# Patient Record
Sex: Male | Born: 1965 | Race: White | Hispanic: No | Marital: Single | Smoking: Former smoker
Health system: Southern US, Community
[De-identification: ages and names within clinical notes are randomized; demographics above are authoritative.]

## PROBLEM LIST (undated history)

## (undated) DIAGNOSIS — K760 Fatty (change of) liver, not elsewhere classified: Secondary | ICD-10-CM

## (undated) DIAGNOSIS — F419 Anxiety disorder, unspecified: Secondary | ICD-10-CM

## (undated) DIAGNOSIS — K219 Gastro-esophageal reflux disease without esophagitis: Secondary | ICD-10-CM

## (undated) DIAGNOSIS — L509 Urticaria, unspecified: Secondary | ICD-10-CM

## (undated) DIAGNOSIS — E7800 Pure hypercholesterolemia, unspecified: Secondary | ICD-10-CM

## (undated) DIAGNOSIS — I10 Essential (primary) hypertension: Secondary | ICD-10-CM

## (undated) DIAGNOSIS — E119 Type 2 diabetes mellitus without complications: Secondary | ICD-10-CM

## (undated) DIAGNOSIS — E785 Hyperlipidemia, unspecified: Secondary | ICD-10-CM

## (undated) DIAGNOSIS — U071 COVID-19: Secondary | ICD-10-CM

## (undated) HISTORY — PX: LUMBAR DISC SURGERY: SHX700

## (undated) HISTORY — DX: Gastro-esophageal reflux disease without esophagitis: K21.9

## (undated) HISTORY — DX: Fatty (change of) liver, not elsewhere classified: K76.0

## (undated) HISTORY — DX: Pure hypercholesterolemia, unspecified: E78.00

## (undated) HISTORY — PX: KNEE SURGERY: SHX244

## (undated) HISTORY — DX: Essential (primary) hypertension: I10

## (undated) HISTORY — DX: COVID-19: U07.1

## (undated) HISTORY — PX: ANKLE SURGERY: SHX546

## (undated) HISTORY — DX: Anxiety disorder, unspecified: F41.9

## (undated) HISTORY — DX: Type 2 diabetes mellitus without complications: E11.9

## (undated) HISTORY — DX: Urticaria, unspecified: L50.9

## (undated) HISTORY — DX: Hyperlipidemia, unspecified: E78.5

---

## 2015-10-13 DIAGNOSIS — Z23 Encounter for immunization: Secondary | ICD-10-CM | POA: Diagnosis not present

## 2015-10-16 DIAGNOSIS — H9202 Otalgia, left ear: Secondary | ICD-10-CM | POA: Diagnosis not present

## 2015-11-07 DIAGNOSIS — Z79899 Other long term (current) drug therapy: Secondary | ICD-10-CM | POA: Diagnosis not present

## 2015-11-15 DIAGNOSIS — E7800 Pure hypercholesterolemia, unspecified: Secondary | ICD-10-CM | POA: Diagnosis not present

## 2015-11-15 DIAGNOSIS — I10 Essential (primary) hypertension: Secondary | ICD-10-CM | POA: Diagnosis not present

## 2016-01-05 DIAGNOSIS — J209 Acute bronchitis, unspecified: Secondary | ICD-10-CM | POA: Diagnosis not present

## 2016-02-17 DIAGNOSIS — Z20828 Contact with and (suspected) exposure to other viral communicable diseases: Secondary | ICD-10-CM | POA: Diagnosis not present

## 2016-05-09 DIAGNOSIS — J209 Acute bronchitis, unspecified: Secondary | ICD-10-CM | POA: Diagnosis not present

## 2016-05-09 DIAGNOSIS — J0100 Acute maxillary sinusitis, unspecified: Secondary | ICD-10-CM | POA: Diagnosis not present

## 2016-05-09 DIAGNOSIS — R062 Wheezing: Secondary | ICD-10-CM | POA: Diagnosis not present

## 2016-05-09 DIAGNOSIS — R0602 Shortness of breath: Secondary | ICD-10-CM | POA: Diagnosis not present

## 2016-05-09 DIAGNOSIS — R0600 Dyspnea, unspecified: Secondary | ICD-10-CM | POA: Diagnosis not present

## 2016-07-29 DIAGNOSIS — J0100 Acute maxillary sinusitis, unspecified: Secondary | ICD-10-CM | POA: Diagnosis not present

## 2016-07-29 DIAGNOSIS — J209 Acute bronchitis, unspecified: Secondary | ICD-10-CM | POA: Diagnosis not present

## 2016-09-11 DIAGNOSIS — Z125 Encounter for screening for malignant neoplasm of prostate: Secondary | ICD-10-CM | POA: Diagnosis not present

## 2016-09-11 DIAGNOSIS — Z23 Encounter for immunization: Secondary | ICD-10-CM | POA: Diagnosis not present

## 2016-09-11 DIAGNOSIS — Z6831 Body mass index (BMI) 31.0-31.9, adult: Secondary | ICD-10-CM | POA: Diagnosis not present

## 2016-09-11 DIAGNOSIS — Z0000 Encounter for general adult medical examination without abnormal findings: Secondary | ICD-10-CM | POA: Diagnosis not present

## 2016-10-16 DIAGNOSIS — H5213 Myopia, bilateral: Secondary | ICD-10-CM | POA: Diagnosis not present

## 2017-04-02 DIAGNOSIS — I10 Essential (primary) hypertension: Secondary | ICD-10-CM | POA: Diagnosis not present

## 2017-04-02 DIAGNOSIS — E7800 Pure hypercholesterolemia, unspecified: Secondary | ICD-10-CM | POA: Diagnosis not present

## 2017-04-02 DIAGNOSIS — Z1339 Encounter for screening examination for other mental health and behavioral disorders: Secondary | ICD-10-CM | POA: Diagnosis not present

## 2017-04-02 DIAGNOSIS — Z1331 Encounter for screening for depression: Secondary | ICD-10-CM | POA: Diagnosis not present

## 2017-04-02 DIAGNOSIS — Z6834 Body mass index (BMI) 34.0-34.9, adult: Secondary | ICD-10-CM | POA: Diagnosis not present

## 2017-07-14 DIAGNOSIS — B029 Zoster without complications: Secondary | ICD-10-CM | POA: Diagnosis not present

## 2017-10-07 DIAGNOSIS — B370 Candidal stomatitis: Secondary | ICD-10-CM | POA: Diagnosis not present

## 2017-12-03 DIAGNOSIS — Z6833 Body mass index (BMI) 33.0-33.9, adult: Secondary | ICD-10-CM | POA: Diagnosis not present

## 2017-12-03 DIAGNOSIS — Z125 Encounter for screening for malignant neoplasm of prostate: Secondary | ICD-10-CM | POA: Diagnosis not present

## 2017-12-03 DIAGNOSIS — Z13220 Encounter for screening for lipoid disorders: Secondary | ICD-10-CM | POA: Diagnosis not present

## 2017-12-03 DIAGNOSIS — Z0000 Encounter for general adult medical examination without abnormal findings: Secondary | ICD-10-CM | POA: Diagnosis not present

## 2017-12-03 DIAGNOSIS — Z23 Encounter for immunization: Secondary | ICD-10-CM | POA: Diagnosis not present

## 2017-12-09 DIAGNOSIS — H524 Presbyopia: Secondary | ICD-10-CM | POA: Diagnosis not present

## 2018-01-20 ENCOUNTER — Ambulatory Visit (INDEPENDENT_AMBULATORY_CARE_PROVIDER_SITE_OTHER): Payer: BLUE CROSS/BLUE SHIELD | Admitting: Pulmonary Disease

## 2018-01-20 ENCOUNTER — Encounter: Payer: Self-pay | Admitting: Pulmonary Disease

## 2018-01-20 VITALS — BP 120/84 | HR 67 | Ht 74.0 in | Wt 273.0 lb

## 2018-01-20 DIAGNOSIS — G4733 Obstructive sleep apnea (adult) (pediatric): Secondary | ICD-10-CM

## 2018-01-20 MED ORDER — MONTELUKAST SODIUM 10 MG PO TABS: 10.0000 mg | ORAL_TABLET | Freq: Every day | ORAL | 11 refills | Status: DC

## 2018-01-20 NOTE — Progress Notes (Signed)
Dan Miller    [id number]    07/16/1965  Primary Care Physician:Redding, Valrie HartJohn F. II, MD  Referring Physician: Noni Saupeedding, John F. II, MD [address] [place], [place] [id number]  Chief complaint:   Patient with a past history of obstructive sleep apnea with recurrence of symptoms  HPI:  Was diagnosed with obstructive sleep apnea over 10 years ago, quit using his machine following significant weight loss and improvement in symptoms He has gained some weight recently with recurrence of his symptoms of daytime sleepiness and snoring Usually goes to bed between 9 and 12 PM, falls asleep almost immediately Wakes up about 1-2 times during the night Final wake up time about 6-7 a.m. Denies any nighttime headaches No dryness of his mouth in the mornings No significant night sweats  No history of obstructive sleep apnea in the family Social smoking in the remote past  He is not limited with activities of daily living  He feels generally well apart from his sleepiness and complaints of snoring  He does suffer from allergies and a lot of nasal stuffiness and congestion  He does have an oral device that he uses at present now but does not seem to be working, snores with the device in place   Outpatient Encounter Medications as of 01/20/2018  Medication Sig  . buPROPion (WELLBUTRIN) 100 MG tablet Take 100 mg by mouth daily.  . cetirizine (ZYRTEC) 5 MG tablet Take 5 mg by mouth daily.  Marland Kitchen. esomeprazole (NEXIUM) 20 MG capsule Take 20 mg by mouth daily.  . fluticasone (FLONASE) 50 MCG/ACT nasal spray Place 2 sprays into both nostrils daily.  Marland Kitchen. lisinopril (PRINIVIL,ZESTRIL) 10 MG tablet Take 10 mg by mouth daily.   No facility-administered encounter medications on file as of 01/20/2018.     Allergies as of 01/20/2018  . (No Known Allergies)    No past medical history on file.  No family history on file.  Social History   Socioeconomic History  . Marital status:  Single    Spouse name: Not on file  . Number of children: Not on file  . Years of education: Not on file  . Highest education level: Not on file  Occupational History  . Not on file  Social Needs  . Financial resource strain: Not on file  . Food insecurity:    Worry: Not on file    Inability: Not on file  . Transportation needs:    Medical: Not on file    Non-medical: Not on file  Tobacco Use  . Smoking status: Never Smoker  . Smokeless tobacco: Never Used  Substance and Sexual Activity  . Alcohol use: Not on file  . Drug use: Not on file  . Sexual activity: Not on file  Lifestyle  . Physical activity:    Days per week: Not on file    Minutes per session: Not on file  . Stress: Not on file  Relationships  . Social connections:    Talks on phone: Not on file    Gets together: Not on file    Attends religious service: Not on file    Active member of club or organization: Not on file    Attends meetings of clubs or organizations: Not on file    Relationship status: Not on file  . Intimate partner violence:    Fear of current or ex partner: Not on file    Emotionally abused: Not on file  Physically abused: Not on file    Forced sexual activity: Not on file  Other Topics Concern  . Not on file  Social History Narrative  . Not on file    Review of Systems  Constitutional: Negative for fatigue.  HENT: Negative.   Eyes: Negative.   Respiratory: Positive for apnea.   Cardiovascular: Negative.   Gastrointestinal: Negative.   Psychiatric/Behavioral: Positive for sleep disturbance.    Vitals:   01/20/18 0912  BP: 120/84  Pulse: 67  SpO2: 97%     Physical Exam  Constitutional: He appears well-developed and well-nourished.  HENT:  Head: Normocephalic and atraumatic.  Mallampati 2  Eyes: Pupils are equal, round, and reactive to light. Conjunctivae and EOM are normal. Right eye exhibits no discharge. Left eye exhibits no discharge.  Neck: Normal range of  motion. Neck supple. No tracheal deviation present. No thyromegaly present.  Cardiovascular: Normal rate.  Pulmonary/Chest: Effort normal and breath sounds normal. No respiratory distress. He has no wheezes.  Abdominal: Soft. Bowel sounds are normal. He exhibits no distension. There is no abdominal tenderness.    Results of the Epworth flowsheet 01/20/2018  Sitting and reading 1  Watching TV 1  Sitting, inactive in a public place (e.g. a theatre or a meeting) 1  As a passenger in a car for an hour without a break 1  Lying down to rest in the afternoon when circumstances permit 3  Sitting and talking to someone 1  Sitting quietly after a lunch without alcohol 1  In a car, while stopped for a few minutes in traffic 1  Total score 10   Assessment:  .  History of moderate obstructive sleep apnea -At least moderate severity at previous diagnosis  .  Obesity -Has gained a significant amount of weight recently with recurrence of his symptoms  .  Excessive daytime sleepiness  Plan/Recommendations:  .  Home sleep study .  Pathophysiology of sleep disordered breathing discussed . Treatment options discussed .  Encouraged to continue working on weight loss efforts I will see him back in the office in about 3 months   Virl DiamondAdewale Demarrius Guerrero MD [facility] 01/20/2018, 9:28 AM  CC: Noni Saupeedding, John F. II, MD

## 2018-01-20 NOTE — Patient Instructions (Signed)
History of obstructive sleep apnea recurrence of symptoms Nasal stuffiness/allergy  We will set you up with a home sleep study We will contact you as results become available  We will see you back in the office in 3 months We will give you a prescription for Singulair to be used daily  Call with any significant concerns   Sleep Apnea Sleep apnea is a condition in which breathing pauses or becomes shallow during sleep. Episodes of sleep apnea usually last 10 seconds or longer, and they may occur as many as 20 times an hour. Sleep apnea disrupts your sleep and keeps your body from getting the rest that it needs. This condition can increase your risk of certain health problems, including:  Heart attack.  Stroke.  Obesity.  Diabetes.  Heart failure.  Irregular heartbeat. There are three kinds of sleep apnea:  Obstructive sleep apnea. This kind is caused by a blocked or collapsed airway.  Central sleep apnea. This kind happens when the part of the brain that controls breathing does not send the correct signals to the muscles that control breathing.  Mixed sleep apnea. This is a combination of obstructive and central sleep apnea. What are the causes? The most common cause of this condition is a collapsed or blocked airway. An airway can collapse or become blocked if:  Your throat muscles are abnormally relaxed.  Your tongue and tonsils are larger than normal.  You are overweight.  Your airway is smaller than normal. What increases the risk? This condition is more likely to develop in people who:  Are overweight.  Smoke.  Have a smaller than normal airway.  Are elderly.  Are male.  Drink alcohol.  Take sedatives or tranquilizers.  Have a family history of sleep apnea. What are the signs or symptoms? Symptoms of this condition include:  Trouble staying asleep.  Daytime sleepiness and tiredness.  Irritability.  Loud snoring.  Morning  headaches.  Trouble concentrating.  Forgetfulness.  Decreased interest in sex.  Unexplained sleepiness.  Mood swings.  Personality changes.  Feelings of depression.  Waking up often during the night to urinate.  Dry mouth.  Sore throat. How is this diagnosed? This condition may be diagnosed with:  A medical history.  A physical exam.  A series of tests that are done while you are sleeping (sleep study). These tests are usually done in a sleep lab, but they may also be done at home. How is this treated? Treatment for this condition aims to restore normal breathing and to ease symptoms during sleep. It may involve managing health issues that can affect breathing, such as high blood pressure or obesity. Treatment may include:  Sleeping on your side.  Using a decongestant if you have nasal congestion.  Avoiding the use of depressants, including alcohol, sedatives, and narcotics.  Losing weight if you are overweight.  Making changes to your diet.  Quitting smoking.  Using a device to open your airway while you sleep, such as: ? An oral appliance. This is a custom-made mouthpiece that shifts your lower jaw forward. ? A continuous positive airway pressure (CPAP) device. This device delivers oxygen to your airway through a mask. ? A nasal expiratory positive airway pressure (EPAP) device. This device has valves that you put into each nostril. ? A bi-level positive airway pressure (BPAP) device. This device delivers oxygen to your airway through a mask.  Surgery if other treatments do not work. During surgery, excess tissue is removed to create a wider  airway. It is important to get treatment for sleep apnea. Without treatment, this condition can lead to:  High blood pressure.  Coronary artery disease.  (Men) An inability to achieve or maintain an erection (impotence).  Reduced thinking abilities. Follow these instructions at home:  Make any lifestyle changes that  your health care provider recommends.  Eat a healthy, well-balanced diet.  Take over-the-counter and prescription medicines only as told by your health care provider.  Avoid using depressants, including alcohol, sedatives, and narcotics.  Take steps to lose weight if you are overweight.  If you were given a device to open your airway while you sleep, use it only as told by your health care provider.  Do not use any tobacco products, such as cigarettes, chewing tobacco, and e-cigarettes. If you need help quitting, ask your health care provider.  Keep all follow-up visits as told by your health care provider. This is important. Contact a health care provider if:  The device that you received to open your airway during sleep is uncomfortable or does not seem to be working.  Your symptoms do not improve.  Your symptoms get worse. Get help right away if:  You develop chest pain.  You develop shortness of breath.  You develop discomfort in your back, arms, or stomach.  You have trouble speaking.  You have weakness on one side of your body.  You have drooping in your face. These symptoms may represent a serious problem that is an emergency. Do not wait to see if the symptoms will go away. Get medical help right away. Call your local emergency services (911 in the [place] Do not drive yourself to the hospital. This information is not intended to replace advice given to you by your health care provider. Make sure you discuss any questions you have with your health care provider. Document Released: 12/14/2001 Document Revised: 07/22/2016 Document Reviewed: 10/03/2014 Elsevier Interactive Patient Education  2019 [employer]

## 2018-01-21 ENCOUNTER — Encounter: Payer: Self-pay | Admitting: Allergy and Immunology

## 2018-01-21 ENCOUNTER — Ambulatory Visit: Payer: BLUE CROSS/BLUE SHIELD | Admitting: Allergy and Immunology

## 2018-01-21 VITALS — BP 118/62 | HR 64 | Resp 16

## 2018-01-21 DIAGNOSIS — J301 Allergic rhinitis due to pollen: Secondary | ICD-10-CM

## 2018-01-21 DIAGNOSIS — Z91018 Allergy to other foods: Secondary | ICD-10-CM

## 2018-01-21 DIAGNOSIS — J3089 Other allergic rhinitis: Secondary | ICD-10-CM | POA: Diagnosis not present

## 2018-01-21 DIAGNOSIS — Z79899 Other long term (current) drug therapy: Secondary | ICD-10-CM

## 2018-01-21 NOTE — Progress Notes (Signed)
Dear Dr. Jeanie Seweredding,  Thank you for referring Dan Miller to the [facility] on 01/21/2018.   Below is a summation of this patient's evaluation and recommendations.  Thank you for your referral. I will keep you informed about this patient's response to treatment.   If you have any questions please do not hesitate to contact me.   Sincerely,  Jessica PriestEric J. , MD Allergy / Immunology [facility]    ______________________________________________________________________    NEW PATIENT NOTE  Referring Provider: Noni Saupeedding, John F. II, MD Primary Provider: Noni Saupeedding, John F. II, MD Date of office visit: 01/21/2018    Subjective:   Chief Complaint:  Dan Miller (DOB: 09/28/1965) is a 53 y.o. male who presents to the clinic on 01/21/2018 with a chief complaint of Food Intolerance .     HPI: Dan Miller presents to this clinic in evaluation of allergic rhinoconjunctivitis and food allergy directed against shrimp.  He was seen by Dr. Willa RoughHicks many years ago for the same issues.  Concerning his allergic rhinoconjunctivitis, he uses Zyrtec and Flonase which has helped him but he still remains with nasal congestion and sneezing especially during seasonal times of the year.  He has recently been given Singulair by his pulmonologist to see if this helps but he has not tried this medication yet.  He is seeing his pulmonologist for sleep apnea related to weight gain.  Concerning his food allergy, he has had a history of developing facial hives after eating shrimp usually about 2 hours after consuming this food product back in 2013.  However, he has been eating shrimp intermittently since that point in time and in fact he has eaten shrimp about 6 times in 2019 without developing any hives.  He is usually using a Zyrtec prior to eating shrimp as he uses Zyrtec on most days.  He eats oysters and lobsters and clams and  squid and Octopus with no problem.  He does remain away from crab.  Previous skin testing performed 07 July 2014 identified slight sensitivity directed against shrimp.  He also had hypersensitivity directed against a multitude of different allergens including grasses, weeds, trees, house dust mite, dog, cockroach, and mold.  Past Medical History:  Diagnosis Date  . Urticaria     History reviewed. No pertinent surgical history.  Allergies as of 01/21/2018      Reactions   Shrimp [shellfish Allergy] Hives      Medication List      buPROPion 100 MG tablet Commonly known as:  WELLBUTRIN Take 100 mg by mouth daily.   cetirizine 5 MG tablet Commonly known as:  ZYRTEC Take 5 mg by mouth daily.   esomeprazole 20 MG capsule Commonly known as:  NEXIUM Take 20 mg by mouth daily.   fluticasone 50 MCG/ACT nasal spray Commonly known as:  FLONASE Place 2 sprays into both nostrils daily.   lisinopril 10 MG tablet Commonly known as:  PRINIVIL,ZESTRIL Take 10 mg by mouth daily.   montelukast 10 MG tablet Commonly known as:  SINGULAIR Take 1 tablet (10 mg total) by mouth at bedtime.       Review of systems negative except as noted in HPI / PMHx or noted below:  Review of Systems  Constitutional: Negative.   HENT: Negative.   Eyes: Negative.   Respiratory: Negative.   Cardiovascular: Negative.   Gastrointestinal: Negative.   Genitourinary: Negative.   Musculoskeletal: Negative.  Skin: Negative.   Neurological: Negative.   Endo/Heme/Allergies: Negative.   Psychiatric/Behavioral: Negative.     History reviewed. No pertinent family history.  Social History   Socioeconomic History  . Marital status: Single    Spouse name: Not on file  . Number of children: Not on file  . Years of education: Not on file  . Highest education level: Not on file  Occupational History  . Not on file  Social Needs  . Financial resource strain: Not on file  . Food insecurity:    Worry:  Not on file    Inability: Not on file  . Transportation needs:    Medical: Not on file    Non-medical: Not on file  Tobacco Use  . Smoking status: Never Smoker  . Smokeless tobacco: Never Used  Substance and Sexual Activity  . Alcohol use: Not on file  . Drug use: Not on file  . Sexual activity: Not on file  Lifestyle  . Physical activity:    Days per week: Not on file    Minutes per session: Not on file  . Stress: Not on file  Relationships  . Social connections:    Talks on phone: Not on file    Gets together: Not on file    Attends religious service: Not on file    Active member of club or organization: Not on file    Attends meetings of clubs or organizations: Not on file    Relationship status: Not on file  . Intimate partner violence:    Fear of current or ex partner: Not on file    Emotionally abused: Not on file    Physically abused: Not on file    Forced sexual activity: Not on file  Other Topics Concern  . Not on file  Social History Narrative  . Not on file    Environmental and Social history  Lives in a house with a dry environment, no animals located inside the household, no carpet in the bedroom, no plastic on the bed, no plastic on the pillow, no smokers located inside the household.  He is a Technical sales engineer.  Objective:   Vitals:   01/21/18 0912  BP: 118/62  Pulse: 64  Resp: 16        Physical Exam Constitutional:      Appearance: He is not diaphoretic.  HENT:     Head: Normocephalic.     Right Ear: Tympanic membrane, ear canal and external ear normal.     Left Ear: Tympanic membrane, ear canal and external ear normal.     Nose: Nose normal. No mucosal edema or rhinorrhea.     Mouth/Throat:     Pharynx: Uvula midline. No oropharyngeal exudate.  Eyes:     Conjunctiva/sclera: Conjunctivae normal.  Neck:     Thyroid: No thyromegaly.     Trachea: Trachea normal. No tracheal tenderness or tracheal deviation.  Cardiovascular:     Rate  and Rhythm: Normal rate and regular rhythm.     Heart sounds: Normal heart sounds, S1 normal and S2 normal. No murmur.  Pulmonary:     Effort: No respiratory distress.     Breath sounds: Normal breath sounds. No stridor. No wheezing or rales.  Lymphadenopathy:     Head:     Right side of head: No tonsillar adenopathy.     Left side of head: No tonsillar adenopathy.     Cervical: No cervical adenopathy.  Skin:    Findings:  No erythema or rash.     Nails: There is no clubbing.   Neurological:     Mental Status: He is alert.     Diagnostics: Allergy skin tests were not performed.   Assessment and Plan:    1. Perennial allergic rhinitis   2. Seasonal allergic rhinitis due to pollen   3. Food allergy   4. On angiotensin-converting enzyme (ACE) inhibitors     1.  Allergen avoidance measures  2.  Can use either Flonase or Dymista 1 spray each nostril twice a day  3.  Can try montelukast/Singulair 10 mg tablet 1 time per day  4.  Can continue Zyrtec 10 mg daily  5.  Auvi-Q 0.3, Benadryl, MD/ER evaluation for allergic reaction  6.  Immunotherapy?  7. Lisinopril?  8.  Return to clinic in 1 year or earlier if problem  I do not think that there should be any restriction on Dan Miller regarding what he eats at this point in time as he has shown over the course of the past year that he can eat shrimp without any problem.  But to be on the safe side we will provide him an Auvi-Q should he ever develop a significant allergic reaction in the future.  Certainly he has an overactive immune system in general with an atopic phenotype and he could easily reacquire hypersensitivity directed against specific food products in the future.  He still remains relatively symptomatic while utilizing anti-inflammatory agents for his airway and he would definitely be a candidate for immunotherapy and I have given him literature on this form of treatment.  He is on an ACE inhibitor and in general for  individuals who are predisposed to the development of significant allergic reactivity and recurrent allergic reactions it would be best for him not to be on an ACE inhibitor as this agent will make treatment of allergic reactions much more difficult and he will discuss this issue further with his primary care doctor.  Jessica Priest, MD Allergy / Immunology [facility]

## 2018-01-21 NOTE — Patient Instructions (Addendum)
  1.  Allergen avoidance measures  2.  Can use either Flonase or Dymista 1 spray each nostril twice a day  3.  Can try montelukast/Singulair 10 mg tablet 1 time per day  4.  Can continue Zyrtec 10 mg daily  5.  Auvi-Q 0.3, Benadryl, MD/ER evaluation for allergic reaction  6.  Immunotherapy?  7. Lisinopril?  8.  Return to clinic in 1 year or earlier if problem

## 2018-01-22 ENCOUNTER — Encounter: Payer: Self-pay | Admitting: Allergy and Immunology

## 2018-01-22 MED ORDER — EPINEPHRINE 0.3 MG/0.3ML IJ SOAJ: INTRAMUSCULAR | 3 refills | Status: DC

## 2018-01-22 MED ORDER — AZELASTINE-FLUTICASONE 137-50 MCG/ACT NA SUSP: NASAL | 5 refills | Status: DC

## 2018-02-04 DIAGNOSIS — G4733 Obstructive sleep apnea (adult) (pediatric): Secondary | ICD-10-CM

## 2018-02-05 DIAGNOSIS — G4733 Obstructive sleep apnea (adult) (pediatric): Secondary | ICD-10-CM

## 2018-02-06 ENCOUNTER — Telehealth: Payer: Self-pay

## 2018-02-06 DIAGNOSIS — G4733 Obstructive sleep apnea (adult) (pediatric): Secondary | ICD-10-CM

## 2018-02-06 NOTE — Telephone Encounter (Signed)
Attempted to call patient today regarding results. I did not receive an answer at time of call. I have left a voicemail message for pt to return call. X1  Dr. Wynona Neat has reviewed the home sleep test this showed moderate obstructive sleep apnea with moderate oxygen desaturations. Patient had 24 apneas, lowest SAO2 was 73%   Recommendations   Treatment options are CPAP with the settings auto 5-10.   Weight loss measures .  Advise against driving while sleepy & against medication with sedative side effects.   Make appointment for 3 months for compliance with download with Dr. Wynona Neat.

## 2018-02-09 NOTE — Telephone Encounter (Signed)
Atc was unable to leave voice mail at this time will call back.

## 2018-02-17 NOTE — Telephone Encounter (Signed)
Called and spoke with pt letting him know the results of the HST and stated him that AO does want him to start on CPAP if he was fine with that. Pt stated that was fine. I have scheduled pt a 57-month follow up appt and have placed order for CPAP start. Nothing further needed.

## 2018-02-19 ENCOUNTER — Other Ambulatory Visit: Payer: Self-pay | Admitting: *Deleted

## 2018-02-19 DIAGNOSIS — G4733 Obstructive sleep apnea (adult) (pediatric): Secondary | ICD-10-CM

## 2018-03-10 DIAGNOSIS — G4733 Obstructive sleep apnea (adult) (pediatric): Secondary | ICD-10-CM | POA: Diagnosis not present

## 2018-04-10 DIAGNOSIS — G4733 Obstructive sleep apnea (adult) (pediatric): Secondary | ICD-10-CM | POA: Diagnosis not present

## 2018-05-10 DIAGNOSIS — G4733 Obstructive sleep apnea (adult) (pediatric): Secondary | ICD-10-CM | POA: Diagnosis not present

## 2018-05-28 ENCOUNTER — Ambulatory Visit: Payer: BLUE CROSS/BLUE SHIELD | Admitting: Adult Health

## 2018-05-28 ENCOUNTER — Ambulatory Visit: Payer: BLUE CROSS/BLUE SHIELD | Admitting: Pulmonary Disease

## 2018-06-02 ENCOUNTER — Encounter: Payer: Self-pay | Admitting: Adult Health

## 2018-06-02 ENCOUNTER — Ambulatory Visit (INDEPENDENT_AMBULATORY_CARE_PROVIDER_SITE_OTHER): Payer: BLUE CROSS/BLUE SHIELD | Admitting: Adult Health

## 2018-06-02 ENCOUNTER — Other Ambulatory Visit: Payer: Self-pay

## 2018-06-02 DIAGNOSIS — G4733 Obstructive sleep apnea (adult) (pediatric): Secondary | ICD-10-CM | POA: Insufficient documentation

## 2018-06-02 HISTORY — DX: Obstructive sleep apnea (adult) (pediatric): G47.33

## 2018-06-02 HISTORY — DX: Morbid (severe) obesity due to excess calories: E66.01

## 2018-06-02 NOTE — Patient Instructions (Addendum)
Continue on CPAP at bedtime. Keep up the good work We will adjust her CPAP pressure  CPAP download in 1 month Work on healthy weight Do not drive if sleepy Follow-up in 4-6 months with Dr. Melida Quitter or [place] NP and As needed

## 2018-06-02 NOTE — Progress Notes (Signed)
Virtual Visit via Telephone Note  I connected with Dan Miller on 06/02/18 at  1:30 PM EDT by telephone and verified that I am speaking with the correct person using two identifiers.  Location: Patient: Home  Provider: Office    I discussed the limitations, risks, security and privacy concerns of performing an evaluation and management service by telephone and the availability of in person appointments. I also discussed with the patient that there may be a patient responsible charge related to this service. The patient expressed understanding and agreed to proceed.   History of Present Illness: Today's tele-visit is a 2-month follow-up for sleep apnea  53 year old male followed for sleep apnea.  Patient was seen for a sleep consult January 2020 to establish for sleep apnea.  He had been diagnosed with obstructive sleep apnea around 10 years ago but quit using his machine after significant weight loss.  Patient had gained some of his weight back and started to have reemergence of his sleep apnea symptoms with restless sleep and snoring .  Patient was set up for a home sleep study that was done in January that showed moderate sleep apnea with an AHI at 24/hour, SaO2 low at 73%.  Patient was recommended to start on CPAP.  Patient says since starting on CPAP he is doing fine. Snoring has resolved. Mask is fits good. Feels he sleeps better. Has minimal daytime sleepiness even prior to starting CPAP  Feels he benefits from CPAP .   Download shows excellent compliance with average daily usage at 8 hours.  Patient is on auto CPAP 5 to 10 cm H2O.  AHI 11/hour.  Minimum leaks.Fredna Dow he can tell his AHI is higher on the nights he has alcohol.        Observations/Objective: January 2020 that showed moderate sleep apnea with an AHI at 24/hour, SaO2 low at 73%.  Home weight 06/02/2018 265lbs   Assessment and Plan: Moderate obstructive sleep apnea-needs improved control.  Will adjust CPAP settings  at 5 to 15 cm H2O with a download in 1 month  Morbid obesity  Plan  Patient Instructions  Continue on CPAP at bedtime. Keep up the good work We will adjust her CPAP pressure  CPAP download in 1 month Work on healthy weight Do not drive if sleepy Follow-up in 4-6 months with Dr. Melida Quitter or Amman Bartel NP and As needed        Follow Up Instructions:  Follow up in 4-6 months with Dr. Melida Quitter or Haley Fuerstenberg NP and As needed      I discussed the assessment and treatment plan with the patient. The patient was provided an opportunity to ask questions and all were answered. The patient agreed with the plan and demonstrated an understanding of the instructions.   The patient was advised to call back or seek an in-person evaluation if the symptoms worsen or if the condition fails to improve as anticipated.  I provided 22 minutes of non-face-to-face time during this encounter.   Rubye Oaks, NP

## 2018-06-04 NOTE — Addendum Note (Signed)
Addended by: Boone Master E on: 06/04/2018 12:44 PM   Modules accepted: Orders

## 2018-06-10 DIAGNOSIS — G4733 Obstructive sleep apnea (adult) (pediatric): Secondary | ICD-10-CM | POA: Diagnosis not present

## 2018-08-11 DIAGNOSIS — Z20828 Contact with and (suspected) exposure to other viral communicable diseases: Secondary | ICD-10-CM | POA: Diagnosis not present

## 2018-08-21 DIAGNOSIS — Z20828 Contact with and (suspected) exposure to other viral communicable diseases: Secondary | ICD-10-CM | POA: Diagnosis not present

## 2018-09-19 ENCOUNTER — Other Ambulatory Visit: Payer: Self-pay | Admitting: Allergy and Immunology

## 2018-12-16 DIAGNOSIS — Z13220 Encounter for screening for lipoid disorders: Secondary | ICD-10-CM | POA: Diagnosis not present

## 2018-12-16 DIAGNOSIS — Z0000 Encounter for general adult medical examination without abnormal findings: Secondary | ICD-10-CM | POA: Diagnosis not present

## 2018-12-16 DIAGNOSIS — Z79899 Other long term (current) drug therapy: Secondary | ICD-10-CM | POA: Diagnosis not present

## 2018-12-16 DIAGNOSIS — Z23 Encounter for immunization: Secondary | ICD-10-CM | POA: Diagnosis not present

## 2018-12-16 DIAGNOSIS — Z1331 Encounter for screening for depression: Secondary | ICD-10-CM | POA: Diagnosis not present

## 2018-12-16 DIAGNOSIS — Z6837 Body mass index (BMI) 37.0-37.9, adult: Secondary | ICD-10-CM | POA: Diagnosis not present

## 2019-01-05 DIAGNOSIS — M25512 Pain in left shoulder: Secondary | ICD-10-CM | POA: Diagnosis not present

## 2019-01-05 DIAGNOSIS — I10 Essential (primary) hypertension: Secondary | ICD-10-CM | POA: Diagnosis not present

## 2019-01-05 DIAGNOSIS — Z6838 Body mass index (BMI) 38.0-38.9, adult: Secondary | ICD-10-CM | POA: Diagnosis not present

## 2019-01-12 DIAGNOSIS — M25512 Pain in left shoulder: Secondary | ICD-10-CM | POA: Diagnosis not present

## 2019-01-12 DIAGNOSIS — R293 Abnormal posture: Secondary | ICD-10-CM | POA: Diagnosis not present

## 2019-01-12 DIAGNOSIS — M2560 Stiffness of unspecified joint, not elsewhere classified: Secondary | ICD-10-CM | POA: Diagnosis not present

## 2019-01-13 DIAGNOSIS — K648 Other hemorrhoids: Secondary | ICD-10-CM | POA: Diagnosis not present

## 2019-01-14 DIAGNOSIS — R293 Abnormal posture: Secondary | ICD-10-CM | POA: Diagnosis not present

## 2019-01-14 DIAGNOSIS — M2560 Stiffness of unspecified joint, not elsewhere classified: Secondary | ICD-10-CM | POA: Diagnosis not present

## 2019-01-14 DIAGNOSIS — M25512 Pain in left shoulder: Secondary | ICD-10-CM | POA: Diagnosis not present

## 2019-01-19 DIAGNOSIS — M25512 Pain in left shoulder: Secondary | ICD-10-CM | POA: Diagnosis not present

## 2019-01-19 DIAGNOSIS — M2560 Stiffness of unspecified joint, not elsewhere classified: Secondary | ICD-10-CM | POA: Diagnosis not present

## 2019-01-19 DIAGNOSIS — R293 Abnormal posture: Secondary | ICD-10-CM | POA: Diagnosis not present

## 2019-01-21 ENCOUNTER — Other Ambulatory Visit: Payer: Self-pay | Admitting: Pulmonary Disease

## 2019-01-25 DIAGNOSIS — Z20828 Contact with and (suspected) exposure to other viral communicable diseases: Secondary | ICD-10-CM | POA: Diagnosis not present

## 2019-01-25 DIAGNOSIS — U071 COVID-19: Secondary | ICD-10-CM | POA: Diagnosis not present

## 2019-01-26 DIAGNOSIS — M25512 Pain in left shoulder: Secondary | ICD-10-CM | POA: Diagnosis not present

## 2019-01-26 DIAGNOSIS — M2560 Stiffness of unspecified joint, not elsewhere classified: Secondary | ICD-10-CM | POA: Diagnosis not present

## 2019-01-26 DIAGNOSIS — R293 Abnormal posture: Secondary | ICD-10-CM | POA: Diagnosis not present

## 2019-01-28 DIAGNOSIS — M25512 Pain in left shoulder: Secondary | ICD-10-CM | POA: Diagnosis not present

## 2019-01-28 DIAGNOSIS — M2560 Stiffness of unspecified joint, not elsewhere classified: Secondary | ICD-10-CM | POA: Diagnosis not present

## 2019-01-28 DIAGNOSIS — R293 Abnormal posture: Secondary | ICD-10-CM | POA: Diagnosis not present

## 2019-02-02 DIAGNOSIS — R293 Abnormal posture: Secondary | ICD-10-CM | POA: Diagnosis not present

## 2019-02-02 DIAGNOSIS — M25512 Pain in left shoulder: Secondary | ICD-10-CM | POA: Diagnosis not present

## 2019-02-02 DIAGNOSIS — M2560 Stiffness of unspecified joint, not elsewhere classified: Secondary | ICD-10-CM | POA: Diagnosis not present

## 2019-02-09 DIAGNOSIS — R293 Abnormal posture: Secondary | ICD-10-CM | POA: Diagnosis not present

## 2019-02-09 DIAGNOSIS — M2560 Stiffness of unspecified joint, not elsewhere classified: Secondary | ICD-10-CM | POA: Diagnosis not present

## 2019-02-09 DIAGNOSIS — M25512 Pain in left shoulder: Secondary | ICD-10-CM | POA: Diagnosis not present

## 2019-02-11 DIAGNOSIS — M25512 Pain in left shoulder: Secondary | ICD-10-CM | POA: Diagnosis not present

## 2019-02-11 DIAGNOSIS — M2560 Stiffness of unspecified joint, not elsewhere classified: Secondary | ICD-10-CM | POA: Diagnosis not present

## 2019-02-11 DIAGNOSIS — R293 Abnormal posture: Secondary | ICD-10-CM | POA: Diagnosis not present

## 2019-02-16 DIAGNOSIS — R293 Abnormal posture: Secondary | ICD-10-CM | POA: Diagnosis not present

## 2019-02-16 DIAGNOSIS — M2560 Stiffness of unspecified joint, not elsewhere classified: Secondary | ICD-10-CM | POA: Diagnosis not present

## 2019-02-16 DIAGNOSIS — M25512 Pain in left shoulder: Secondary | ICD-10-CM | POA: Diagnosis not present

## 2019-02-18 DIAGNOSIS — M25512 Pain in left shoulder: Secondary | ICD-10-CM | POA: Diagnosis not present

## 2019-02-18 DIAGNOSIS — M2560 Stiffness of unspecified joint, not elsewhere classified: Secondary | ICD-10-CM | POA: Diagnosis not present

## 2019-02-18 DIAGNOSIS — R293 Abnormal posture: Secondary | ICD-10-CM | POA: Diagnosis not present

## 2019-02-23 DIAGNOSIS — M2560 Stiffness of unspecified joint, not elsewhere classified: Secondary | ICD-10-CM | POA: Diagnosis not present

## 2019-02-23 DIAGNOSIS — M25512 Pain in left shoulder: Secondary | ICD-10-CM | POA: Diagnosis not present

## 2019-02-23 DIAGNOSIS — R293 Abnormal posture: Secondary | ICD-10-CM | POA: Diagnosis not present

## 2019-03-02 DIAGNOSIS — R293 Abnormal posture: Secondary | ICD-10-CM | POA: Diagnosis not present

## 2019-03-02 DIAGNOSIS — M25512 Pain in left shoulder: Secondary | ICD-10-CM | POA: Diagnosis not present

## 2019-03-02 DIAGNOSIS — M2560 Stiffness of unspecified joint, not elsewhere classified: Secondary | ICD-10-CM | POA: Diagnosis not present

## 2019-03-25 ENCOUNTER — Other Ambulatory Visit: Payer: Self-pay | Admitting: Pulmonary Disease

## 2019-07-28 DIAGNOSIS — G4733 Obstructive sleep apnea (adult) (pediatric): Secondary | ICD-10-CM | POA: Diagnosis not present

## 2019-11-09 DIAGNOSIS — G4733 Obstructive sleep apnea (adult) (pediatric): Secondary | ICD-10-CM | POA: Diagnosis not present

## 2020-02-18 ENCOUNTER — Other Ambulatory Visit: Payer: Self-pay | Admitting: Pulmonary Disease

## 2020-02-21 ENCOUNTER — Encounter: Payer: Self-pay | Admitting: *Deleted

## 2020-02-21 ENCOUNTER — Encounter: Payer: Self-pay | Admitting: Cardiology

## 2020-03-09 ENCOUNTER — Ambulatory Visit: Payer: Commercial Managed Care - PPO | Admitting: Cardiology

## 2020-03-09 ENCOUNTER — Other Ambulatory Visit: Payer: Self-pay

## 2020-03-09 ENCOUNTER — Encounter: Payer: Self-pay | Admitting: Cardiology

## 2020-03-09 DIAGNOSIS — E785 Hyperlipidemia, unspecified: Secondary | ICD-10-CM

## 2020-03-09 DIAGNOSIS — I10 Essential (primary) hypertension: Secondary | ICD-10-CM

## 2020-03-09 HISTORY — DX: Essential (primary) hypertension: I10

## 2020-03-09 HISTORY — DX: Hyperlipidemia, unspecified: E78.5

## 2020-03-09 NOTE — Patient Instructions (Signed)
Medication Instructions:  Your physician recommends that you continue on your current medications as directed. Please refer to the Current Medication list given to you today.  *If you need a refill on your cardiac medications before your next appointment, please call your pharmacy*   Lab Work: None. If you have labs (blood work) drawn today and your tests are completely normal, you will receive your results only by: . MyChart Message (if you have MyChart) OR . A paper copy in the mail If you have any lab test that is abnormal or we need to change your treatment, we will call you to review the results.   Testing/Procedures: Your physician has requested that you have an echocardiogram. Echocardiography is a painless test that uses sound waves to create images of your heart. It provides your doctor with information about the size and shape of your heart and how well your heart's chambers and valves are working. This procedure takes approximately one hour. There are no restrictions for this procedure.  Non-Cardiac CT scanning, (CAT scanning), is a noninvasive, special x-ray that produces cross-sectional images of the body using x-rays and a computer. CT scans help physicians diagnose and treat medical conditions. For some CT exams, a contrast material is used to enhance visibility in the area of the body being studied. CT scans provide greater clarity and reveal more details than regular x-ray exams.     Follow-Up: At [facility], you and your health needs are our priority.  As part of our continuing mission to provide you with exceptional heart care, we have created designated Provider Care Teams.  These Care Teams include your primary Cardiologist (physician) and Advanced Practice Providers (APPs -  Physician Assistants and Nurse Practitioners) who all work together to provide you with the care you need, when you need it.  We recommend signing up for the patient portal called "MyChart".   Sign up information is provided on this After Visit Summary.  MyChart is used to connect with patients for Virtual Visits (Telemedicine).  Patients are able to view lab/test results, encounter notes, upcoming appointments, etc.  Non-urgent messages can be sent to your provider as well.   To learn more about what you can do with MyChart, go to [email or web address].    Your next appointment:   6 week(s)  The format for your next appointment:   In Person  Provider:   Robert Krasowski, MD   Other Instructions   Echocardiogram An echocardiogram is a test that uses sound waves (ultrasound) to produce images of the heart. Images from an echocardiogram can provide important information about:  Heart size and shape.  The size and thickness and movement of your heart's walls.  Heart muscle function and strength.  Heart valve function or if you have stenosis. Stenosis is when the heart valves are too narrow.  If blood is flowing backward through the heart valves (regurgitation).  A tumor or infectious growth around the heart valves.  Areas of heart muscle that are not working well because of poor blood flow or injury from a heart attack.  Aneurysm detection. An aneurysm is a weak or damaged part of an artery wall. The wall bulges out from the normal force of blood pumping through the body. Tell a health care provider about:  Any allergies you have.  All medicines you are taking, including vitamins, herbs, eye drops, creams, and over-the-counter medicines.  Any blood disorders you have.  Any surgeries you have had.  Any medical conditions you   have.  Whether you are pregnant or may be pregnant. What are the risks? Generally, this is a safe test. However, problems may occur, including an allergic reaction to dye (contrast) that may be used during the test. What happens before the test? No specific preparation is needed. You may eat and drink normally. What happens during the  test?  You will take off your clothes from the waist up and put on a hospital gown.  Electrodes or electrocardiogram (ECG)patches may be placed on your chest. The electrodes or patches are then connected to a device that monitors your heart rate and rhythm.  You will lie down on a table for an ultrasound exam. A gel will be applied to your chest to help sound waves pass through your skin.  A handheld device, called a transducer, will be pressed against your chest and moved over your heart. The transducer produces sound waves that travel to your heart and bounce back (or "echo" back) to the transducer. These sound waves will be captured in real-time and changed into images of your heart that can be viewed on a video monitor. The images will be recorded on a computer and reviewed by your health care provider.  You may be asked to change positions or hold your breath for a short time. This makes it easier to get different views or better views of your heart.  In some cases, you may receive contrast through an IV in one of your veins. This can improve the quality of the pictures from your heart. The procedure may vary among health care providers and hospitals.   What can I expect after the test? You may return to your normal, everyday life, including diet, activities, and medicines, unless your health care provider tells you not to do that. Follow these instructions at home:  It is up to you to get the results of your test. Ask your health care provider, or the department that is doing the test, when your results will be ready.  Keep all follow-up visits. This is important. Summary  An echocardiogram is a test that uses sound waves (ultrasound) to produce images of the heart.  Images from an echocardiogram can provide important information about the size and shape of your heart, heart muscle function, heart valve function, and other possible heart problems.  You do not need to do anything to  prepare before this test. You may eat and drink normally.  After the echocardiogram is completed, you may return to your normal, everyday life, unless your health care provider tells you not to do that. This information is not intended to replace advice given to you by your health care provider. Make sure you discuss any questions you have with your health care provider. Document Revised: 08/17/2019 Document Reviewed: 08/17/2019 Elsevier Patient Education  2021 [employer].

## 2020-03-09 NOTE — Progress Notes (Signed)
Cardiology Consultation:    Date:  03/09/2020   ID:  Dan Miller, DOB May 23, 1965, MRN [id number]  PCP:  Noni Saupe, MD  Cardiologist:  Gypsy Balsam, MD   Referring MD: Noni Saupe, MD   Chief Complaint  Patient presents with  . cardiac evaluation    History of Present Illness:    Dan Miller is a 55 y.o. male who is being seen today for the evaluation of cardiac evaluation at the request of Noni Saupe, MD.  Past medical history significant for essential hypertension, obstructive sleep apnea, dyslipidemia, obesity.  He was referred to [place] to have evaluation of his risk factors for coronary artery disease.  Recently his friend died because of ruptured aortic aneurysm and he shaken by that he would like to be checked make sure everything is fine.  He said in his life he had a related to stress test both were negative last one was about 10 years ago.  He denies have any chest pain tightness squeezing pressure burning chest.  He does complain of having some shortness of breath with exertion he blames partially to his weight gain and he is will start working on losing weight.  Denies have any palpitations no dizziness no passing out.  Sometimes have swelling of lower extremities.  He is being treated for sleep apnea for few years already and satisfied with the treatment.  He does not exercise on the regular basis, recently he changed his diet and start eating better.  Does have family history of coronary artery disease but not premature, smoke only for very short.  Time in college.  He drinks some alcohol especially weekend typically vodka.  Past Medical History:  Diagnosis Date  . Anxiety   . Benign essential hypertension   . COVID-19 with multiple comorbidities   . Esophageal reflux   . Fatty liver    Known to Dr. Jennye Boroughs  . Morbid obesity (HCC) 06/02/2018  . OSA (obstructive sleep apnea) 06/02/2018  . Pure hypercholesterolemia   . Urticaria      Past Surgical History:  Procedure Laterality Date  . ANKLE SURGERY Left    Orif of ankle  . KNEE SURGERY Right   . LUMBAR DISC SURGERY      Current Medications: Current Meds  Medication Sig  . buPROPion (WELLBUTRIN XL) 300 MG 24 hr tablet Take 300 mg by mouth daily.  . cetirizine (ZYRTEC) 10 MG tablet Take 10 mg by mouth daily.  Marland Kitchen EPINEPHrine (AUVI-Q) 0.3 mg/0.3 mL IJ SOAJ injection Use for life threatening allergic reactions  . esomeprazole (NEXIUM) 40 MG capsule Take 40 mg by mouth daily at 12 noon.  . fluticasone (FLONASE) 50 MCG/ACT nasal spray Place 1 spray into both nostrils daily.  Marland Kitchen lisinopril-hydrochlorothiazide (ZESTORETIC) 20-12.5 MG tablet Take 1 tablet by mouth daily.  . metoprolol succinate (TOPROL-XL) 50 MG 24 hr tablet Take 50 mg by mouth daily. Take with or immediately following a meal.  . Multiple Vitamin (MULTIVITAMIN) tablet Take 1 tablet by mouth daily.  . rosuvastatin (CRESTOR) 5 MG tablet Take 5 mg by mouth daily.     Allergies:   Shrimp [shellfish allergy]   Social History   Socioeconomic History  . Marital status: Single    Spouse name: Not on file  . Number of children: Not on file  . Years of education: Not on file  . Highest education level: Not on file  Occupational History  . Not on  file  Tobacco Use  . Smoking status: Former Games developer  . Smokeless tobacco: Never Used  Substance and Sexual Activity  . Alcohol use: Yes  . Drug use: Not on file  . Sexual activity: Not on file  Other Topics Concern  . Not on file  Social History Narrative  . Not on file   Social Determinants of Health   Financial Resource Strain: Not on file  Food Insecurity: Not on file  Transportation Needs: Not on file  Physical Activity: Not on file  Stress: Not on file  Social Connections: Not on file     Family History: The patient's family history is not on file. ROS:   Please see the history of present illness.    All 14 point review of systems negative  except as described per history of present illness.  EKGs/Labs/Other Studies Reviewed:    The following studies were reviewed today:   EKG:  EKG is  ordered today.  The ekg ordered today demonstrates sinus bradycardia, normal P interval, normal QS complex duration folds, nonspecific ST segment changes  Recent Labs: No results found for requested labs within last 8760 hours.  Recent Lipid Panel No results found for: CHOL, TRIG, HDL, CHOLHDL, VLDL, LDLCALC, LDLDIRECT  Physical Exam:    VS:  BP 126/78 (BP Location: Left Arm, Patient Position: Sitting)   Pulse (!) 58   Ht 6\' 3"  (1.905 m)   Wt 287 lb (130.2 kg)   SpO2 97%   BMI 35.87 kg/m     Wt Readings from Last 3 Encounters:  03/09/20 287 lb (130.2 kg)  02/16/20 292 lb (132.5 kg)  01/20/18 273 lb (123.8 kg)     GEN:  Well nourished, well developed in no acute distress HEENT: Normal NECK: No JVD; No carotid bruits LYMPHATICS: No lymphadenopathy CARDIAC: RRR, no murmurs, no rubs, no gallops RESPIRATORY:  Clear to auscultation without rales, wheezing or rhonchi  ABDOMEN: Soft, non-tender, non-distended MUSCULOSKELETAL:  No edema; No deformity  SKIN: Warm and dry NEUROLOGIC:  Alert and oriented x 3 PSYCHIATRIC:  Normal affect   ASSESSMENT:    1. Essential hypertension   2. Dyslipidemia    PLAN:    In order of problems listed above:  1. Essential hypertension: His blood pressure well controlled today we will continue present management.  He will be scheduled to have an echocardiogram to assess left ventricle ejection fraction and look for left ventricle hypertrophy. 2. Dyspnea on exertion multifactorial.  He does not have anything that would suggest presence of significant obstructive coronary artery disease, there is no exertional chest pain tightness squeezing pressure burning chest.  He did have related to stress test in his life both were negative.  I think there is some value of doing calcium score to see what his  risk would be for coronary artery disease.  In the meantime we will continue statin.  We will schedule him to have an echocardiogram to assess left ventricle ejection fraction. 3. Risk factors for coronary artery disease.  I again I will schedule him to have a calcium score to stratify his risks. 4. We did talk about healthy lifestyle need to exercise on the regular basis, likely he does not smoke.  Asked him to reduce amount of alcohol that he drinks.  I encouraged him to continue with the CPAP mask.  We did initiate conversation about good diet and will continue this conversation in the future.  I see him back in my office in about  6 weeks or sooner if he has a problem.   Medication Adjustments/Labs and Tests Ordered: Current medicines are reviewed at length with the patient today.  Concerns regarding medicines are outlined above.  No orders of the defined types were placed in this encounter.  No orders of the defined types were placed in this encounter.   Signed, Georgeanna Lea, MD, [facility]. 03/09/2020 9:30 AM    [facility]

## 2020-04-06 ENCOUNTER — Ambulatory Visit (INDEPENDENT_AMBULATORY_CARE_PROVIDER_SITE_OTHER)
Admission: RE | Admit: 2020-04-06 | Discharge: 2020-04-06 | Disposition: A | Discharge status: Home / Self Care | Payer: Self-pay | Source: Ambulatory Visit | Attending: Cardiology | Admitting: Cardiology

## 2020-04-06 ENCOUNTER — Other Ambulatory Visit: Payer: Self-pay

## 2020-04-06 DIAGNOSIS — I10 Essential (primary) hypertension: Secondary | ICD-10-CM

## 2020-04-06 DIAGNOSIS — E785 Hyperlipidemia, unspecified: Secondary | ICD-10-CM

## 2020-04-10 ENCOUNTER — Other Ambulatory Visit: Payer: Self-pay

## 2020-04-10 ENCOUNTER — Ambulatory Visit (INDEPENDENT_AMBULATORY_CARE_PROVIDER_SITE_OTHER): Payer: Commercial Managed Care - PPO

## 2020-04-10 DIAGNOSIS — I10 Essential (primary) hypertension: Secondary | ICD-10-CM | POA: Insufficient documentation

## 2020-04-10 DIAGNOSIS — E785 Hyperlipidemia, unspecified: Secondary | ICD-10-CM

## 2020-04-10 DIAGNOSIS — U071 COVID-19: Secondary | ICD-10-CM | POA: Insufficient documentation

## 2020-04-10 DIAGNOSIS — E7800 Pure hypercholesterolemia, unspecified: Secondary | ICD-10-CM | POA: Insufficient documentation

## 2020-04-10 DIAGNOSIS — K760 Fatty (change of) liver, not elsewhere classified: Secondary | ICD-10-CM | POA: Insufficient documentation

## 2020-04-10 DIAGNOSIS — F419 Anxiety disorder, unspecified: Secondary | ICD-10-CM | POA: Insufficient documentation

## 2020-04-10 DIAGNOSIS — L509 Urticaria, unspecified: Secondary | ICD-10-CM | POA: Insufficient documentation

## 2020-04-10 DIAGNOSIS — K219 Gastro-esophageal reflux disease without esophagitis: Secondary | ICD-10-CM | POA: Insufficient documentation

## 2020-04-10 LAB — ECHOCARDIOGRAM COMPLETE
Area-P 1/2: 3.65 cm2
S' Lateral: 3.8 cm
Single Plane A4C EF: 54.7 %

## 2020-04-10 NOTE — Progress Notes (Signed)
Complete echocardiogram performed.  Jimmy Nafis Farnan RDCS, RVT  

## 2020-04-12 ENCOUNTER — Other Ambulatory Visit: Payer: Self-pay

## 2020-04-12 ENCOUNTER — Encounter: Payer: Self-pay | Admitting: Cardiology

## 2020-04-12 ENCOUNTER — Ambulatory Visit: Payer: Commercial Managed Care - PPO | Admitting: Cardiology

## 2020-04-12 VITALS — BP 132/78 | HR 59 | Ht 75.0 in | Wt 276.0 lb

## 2020-04-12 DIAGNOSIS — E785 Hyperlipidemia, unspecified: Secondary | ICD-10-CM | POA: Diagnosis not present

## 2020-04-12 DIAGNOSIS — I7121 Aneurysm of the ascending aorta, without rupture: Secondary | ICD-10-CM

## 2020-04-12 DIAGNOSIS — I2510 Atherosclerotic heart disease of native coronary artery without angina pectoris: Secondary | ICD-10-CM

## 2020-04-12 DIAGNOSIS — I10 Essential (primary) hypertension: Secondary | ICD-10-CM | POA: Diagnosis not present

## 2020-04-12 DIAGNOSIS — I712 Thoracic aortic aneurysm, without rupture: Secondary | ICD-10-CM

## 2020-04-12 HISTORY — DX: Aneurysm of the ascending aorta, without rupture: I71.21

## 2020-04-12 HISTORY — DX: Atherosclerotic heart disease of native coronary artery without angina pectoris: I25.10

## 2020-04-12 MED ORDER — ROSUVASTATIN CALCIUM 10 MG PO TABS: 10.0000 mg | ORAL_TABLET | Freq: Every day | ORAL | 1 refills | Status: DC

## 2020-04-12 NOTE — Progress Notes (Signed)
Cardiology Office Note:    Date:  04/12/2020   ID:  Dan Miller, DOB 12-12-1965, MRN [id number]  PCP:  Noni Saupe, MD  Cardiologist:  Gypsy Balsam, MD    Referring MD: Noni Saupe, MD   Chief Complaint  Patient presents with  . Results    History of Present Illness:    Dan Miller is a 55 y.o. male who initially came to [place] to be evaluated for risk factors for coronary artery disease.  What prompted his visit is the fact that his friend end up dying suddenly because of rupture aneurysm.  He does have risk factors for coronary artery disease he does have essential hypertension, dyslipidemia, immobility.  We end up doing calcium score which came very high 996.  This is 99 percentile.  Likely he is asymptomatic.  He does not have any chest pain tightness squeezing pressure burning chest.  He does have some exertional shortness of breath.  Also echocardiogram showed his ejection fraction being 5055% which is low limits of normal as well as the enlargement of the ascending aorta.  Only 41 mm.  None of this required any intervention at this stage however careful risk factors modification is in order.  Again he denies have any chest pain tightness squeezing pressure burning chest.  He decided to related to change his life he change his diet  Past Medical History:  Diagnosis Date  . Anxiety   . Benign essential hypertension   . COVID-19 with multiple comorbidities   . Dyslipidemia 03/09/2020  . Esophageal reflux   . Essential hypertension 03/09/2020  . Fatty liver    Known to Dr. Jennye Boroughs  . Morbid obesity (HCC) 06/02/2018  . OSA (obstructive sleep apnea) 06/02/2018  . Pure hypercholesterolemia   . Urticaria     Past Surgical History:  Procedure Laterality Date  . ANKLE SURGERY Left    Orif of ankle  . KNEE SURGERY Right   . LUMBAR DISC SURGERY      Current Medications: Current Meds  Medication Sig  . buPROPion (WELLBUTRIN XL) 300 MG 24 hr tablet Take  300 mg by mouth daily.  . cetirizine (ZYRTEC) 10 MG tablet Take 10 mg by mouth daily.  Marland Kitchen EPINEPHrine (AUVI-Q) 0.3 mg/0.3 mL IJ SOAJ injection Use for life threatening allergic reactions (Patient taking differently: Inject 0.3 mg into the muscle as needed for anaphylaxis. Use for life threatening allergic reactions)  . esomeprazole (NEXIUM) 40 MG capsule Take 40 mg by mouth daily at 12 noon.  . fluticasone (FLONASE) 50 MCG/ACT nasal spray Place 1 spray into both nostrils daily.  Marland Kitchen lisinopril-hydrochlorothiazide (ZESTORETIC) 20-12.5 MG tablet Take 1 tablet by mouth daily.  . metoprolol succinate (TOPROL-XL) 50 MG 24 hr tablet Take 50 mg by mouth daily. Take with or immediately following a meal.  . Multiple Vitamin (MULTIVITAMIN) tablet Take 1 tablet by mouth daily.  . [DISCONTINUED] rosuvastatin (CRESTOR) 5 MG tablet Take 5 mg by mouth daily.     Allergies:   Shrimp [shellfish allergy]   Social History   Socioeconomic History  . Marital status: Single    Spouse name: Not on file  . Number of children: Not on file  . Years of education: Not on file  . Highest education level: Not on file  Occupational History  . Not on file  Tobacco Use  . Smoking status: Former Games developer  . Smokeless tobacco: Never Used  Substance and Sexual Activity  . Alcohol use:  Yes  . Drug use: Not on file  . Sexual activity: Not on file  Other Topics Concern  . Not on file  Social History Narrative  . Not on file   Social Determinants of Health   Financial Resource Strain: Not on file  Food Insecurity: Not on file  Transportation Needs: Not on file  Physical Activity: Not on file  Stress: Not on file  Social Connections: Not on file     Family History: The patient's family history is not on file. ROS:   Please see the history of present illness.    All 14 point review of systems negative except as described per history of present illness  EKGs/Labs/Other Studies Reviewed:      Recent Labs: No  results found for requested labs within last 8760 hours.  Recent Lipid Panel No results found for: CHOL, TRIG, HDL, CHOLHDL, VLDL, LDLCALC, LDLDIRECT  Physical Exam:    VS:  BP 132/78 (BP Location: Right Arm, Patient Position: Sitting)   Pulse (!) 59   Ht 6\' 3"  (1.905 m)   Wt 276 lb (125.2 kg)   SpO2 97%   BMI 34.50 kg/m     Wt Readings from Last 3 Encounters:  04/12/20 276 lb (125.2 kg)  03/09/20 287 lb (130.2 kg)  02/16/20 292 lb (132.5 kg)     GEN:  Well nourished, well developed in no acute distress HEENT: Normal NECK: No JVD; No carotid bruits LYMPHATICS: No lymphadenopathy CARDIAC: RRR, no murmurs, no rubs, no gallops RESPIRATORY:  Clear to auscultation without rales, wheezing or rhonchi  ABDOMEN: Soft, non-tender, non-distended MUSCULOSKELETAL:  No edema; No deformity  SKIN: Warm and dry LOWER EXTREMITIES: no swelling NEUROLOGIC:  Alert and oriented x 3 PSYCHIATRIC:  Normal affect   ASSESSMENT:    1. Essential hypertension   2. Coronary artery disease involving native coronary artery of native heart without angina pectoris   3. Dyslipidemia   4. Ascending aortic aneurysm (HCC) 41 mm based on echocardiogram    PLAN:    In order of problems listed above:  1. Coronary disease as proven by calcification of the coronary artery with high calcium score of 996.  We did discuss the significance of this finding.  We need to intensify his statin therapy.  I will increase dose of Crestor from 5 to 10 mg daily.  We will check his fasting lipid profile in about 6 weeks.  We did talk about need to exercise on the regular basis ask him to gently carefully start exercising.  We did again discuss issue of Mediterranean diet which is strongly recommended.  I told him if he develop any chest pain tightness squeezing pressure burning chest he need to let me know. 2. Ascending artery count is a 43 mm.  I told him he needs to have a CT of his chest and abdomen to look into that aorta.   However, he does not want to do it right now because his wife just changed her job and he does not have any insurance he want a wait until insurance will be active which will be within the next few months then will do it. 3. Essential hypertension, blood pressure seems to be reasonably well controlled.  We will continue monitoring this.  I told him that he at the point that minor changes in his lifestyle can give his big benefits in the future he is excited about this and he is ready to do that.   Medication Adjustments/Labs  and Tests Ordered: Current medicines are reviewed at length with the patient today.  Concerns regarding medicines are outlined above.  No orders of the defined types were placed in this encounter.  Medication changes:  Meds ordered this encounter  Medications  . rosuvastatin (CRESTOR) 10 MG tablet    Sig: Take 1 tablet (10 mg total) by mouth daily.    Dispense:  90 tablet    Refill:  1    Signed, Georgeanna Lea, MD, [facility] 04/12/2020 3:14 PM    [facility]

## 2020-04-12 NOTE — Patient Instructions (Signed)
Medication Instructions:  Your physician has recommended you make the following change in your medication:   INCREASE: Crestor 10 mg daily  *If you need a refill on your cardiac medications before your next appointment, please call your pharmacy*   Lab Work: None If you have labs (blood work) drawn today and your tests are completely normal, you will receive your results only by: Marland Kitchen MyChart Message (if you have MyChart) OR . A paper copy in the mail If you have any lab test that is abnormal or we need to change your treatment, we will call you to review the results.   Testing/Procedures: None   Follow-Up: At [facility], you and your health needs are our priority.  As part of our continuing mission to provide you with exceptional heart care, we have created designated Provider Care Teams.  These Care Teams include your primary Cardiologist (physician) and Advanced Practice Providers (APPs -  Physician Assistants and Nurse Practitioners) who all work together to provide you with the care you need, when you need it.  We recommend signing up for the patient portal called "MyChart".  Sign up information is provided on this After Visit Summary.  MyChart is used to connect with patients for Virtual Visits (Telemedicine).  Patients are able to view lab/test results, encounter notes, upcoming appointments, etc.  Non-urgent messages can be sent to your provider as well.   To learn more about what you can do with MyChart, go to [email or web address].    Your next appointment:   3 month(s)  The format for your next appointment:   In Person  Provider:   Gypsy Balsam, MD   Other Instructions

## 2020-04-20 ENCOUNTER — Ambulatory Visit: Payer: Commercial Managed Care - PPO | Admitting: Cardiology

## 2020-06-22 DIAGNOSIS — G4733 Obstructive sleep apnea (adult) (pediatric): Secondary | ICD-10-CM | POA: Diagnosis not present

## 2020-07-26 ENCOUNTER — Ambulatory Visit: Payer: Commercial Managed Care - PPO | Admitting: Cardiology

## 2020-12-18 DIAGNOSIS — J069 Acute upper respiratory infection, unspecified: Secondary | ICD-10-CM | POA: Diagnosis not present

## 2020-12-20 DIAGNOSIS — R519 Headache, unspecified: Secondary | ICD-10-CM | POA: Diagnosis not present

## 2020-12-20 DIAGNOSIS — J0100 Acute maxillary sinusitis, unspecified: Secondary | ICD-10-CM | POA: Diagnosis not present

## 2020-12-20 DIAGNOSIS — Z20828 Contact with and (suspected) exposure to other viral communicable diseases: Secondary | ICD-10-CM | POA: Diagnosis not present

## 2021-01-01 DIAGNOSIS — G4733 Obstructive sleep apnea (adult) (pediatric): Secondary | ICD-10-CM | POA: Diagnosis not present

## 2021-01-17 DIAGNOSIS — Z6837 Body mass index (BMI) 37.0-37.9, adult: Secondary | ICD-10-CM | POA: Diagnosis not present

## 2021-01-17 DIAGNOSIS — I10 Essential (primary) hypertension: Secondary | ICD-10-CM | POA: Diagnosis not present

## 2021-01-17 DIAGNOSIS — Z23 Encounter for immunization: Secondary | ICD-10-CM | POA: Diagnosis not present

## 2021-01-17 DIAGNOSIS — Z1331 Encounter for screening for depression: Secondary | ICD-10-CM | POA: Diagnosis not present

## 2021-01-17 DIAGNOSIS — Z0000 Encounter for general adult medical examination without abnormal findings: Secondary | ICD-10-CM | POA: Diagnosis not present

## 2021-01-19 DIAGNOSIS — H524 Presbyopia: Secondary | ICD-10-CM | POA: Diagnosis not present

## 2021-01-19 DIAGNOSIS — I10 Essential (primary) hypertension: Secondary | ICD-10-CM | POA: Diagnosis not present

## 2021-04-05 IMAGING — CT CT CARDIAC CORONARY ARTERY CALCIUM SCORE
3 series · 13 of 20 positions shown, 14 images · non-contrast
Comparison: None.
COMPARISON: None.
COMPARISON: None.

Addendum:
EXAM:
OVER-READ INTERPRETATION  CT CHEST

The following report is an over-read performed by radiologist Dr.
Ashmi Kusain [REDACTED] on 04/06/2020. This
over-read does not include interpretation of cardiac or coronary
anatomy or pathology. The coronary calcium score/coronary CTA
interpretation by the cardiologist is attached.
CLINICAL DATA: Cardiovascular Disease Risk stratification
Coronary Calcium Score
TECHNIQUE: A gated, non-contrast computed tomography scan of the heart was
performed using 3mm slice thickness. Axial images were analyzed on a
dedicated workstation. Calcium scoring of the coronary arteries was
performed using the Agatston method.
CLINICAL DATA: Risk stratification
TECHNIQUE: The patient was scanned on a Siemens Force scanner. Axial
non-contrast 3 mm slices were carried out through the heart. The
data set was analyzed on a dedicated work station and scored using
the Agatson method.

[Series 2: casc 3.0 bv41 2 bestdiast 73 % · axial · 0.48mm/px · z∈[-234,-162]mm · 4 of 42 slices shown, 5 images]
[im 9/42  vessel]
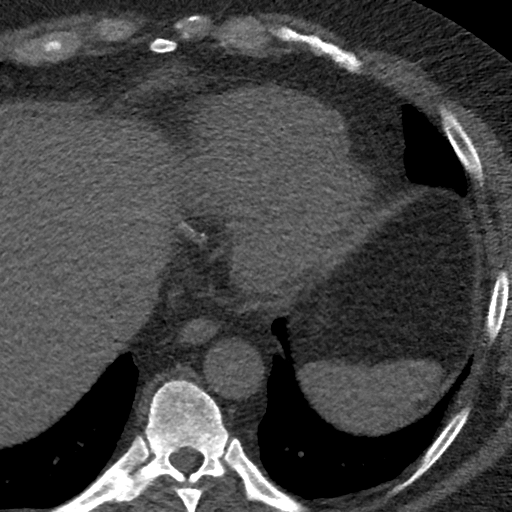
[im 9/42  lung]
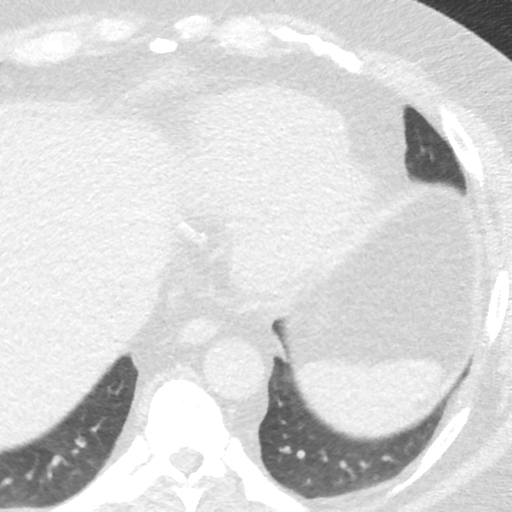
[im 17/42  vessel]
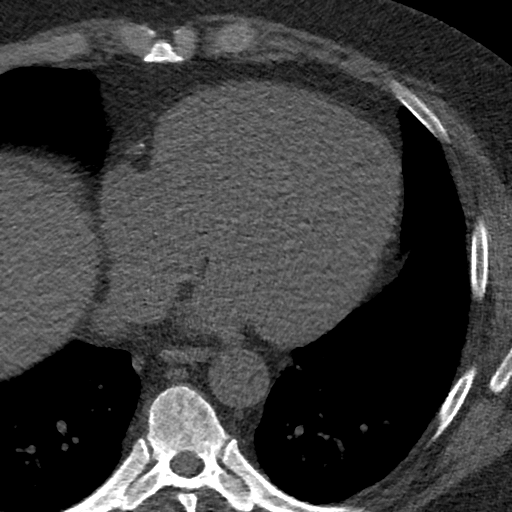
[im 25/42  vessel]
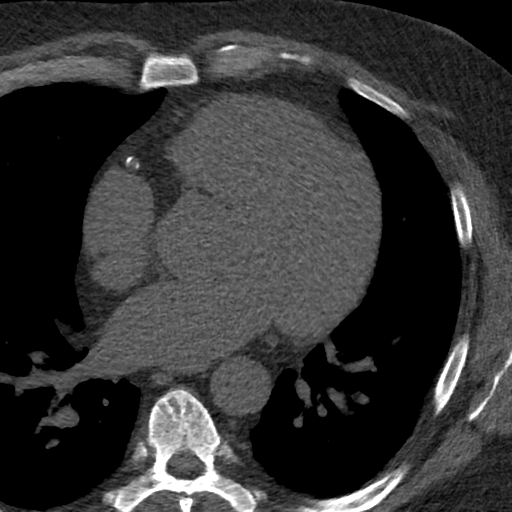
[im 33/42  vessel]
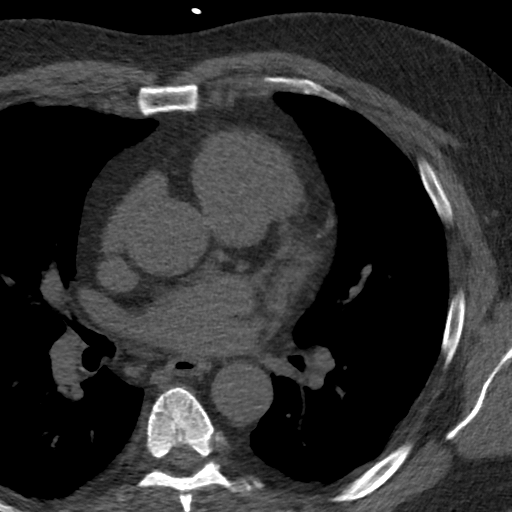

[Series 3: lung 73 % · axial · 0.79mm/px · z∈[-240,-156]mm · 5 of 42 slices shown]
[im 7/42  lung]
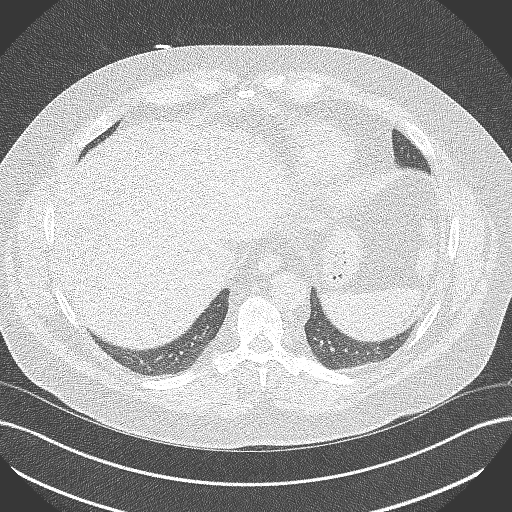
[im 14/42  lung]
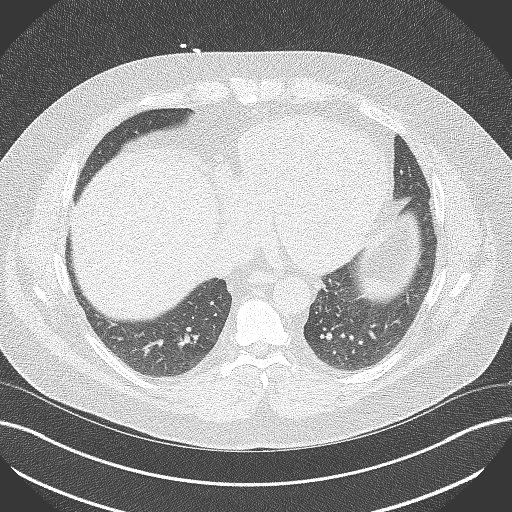
[im 21/42  lung]
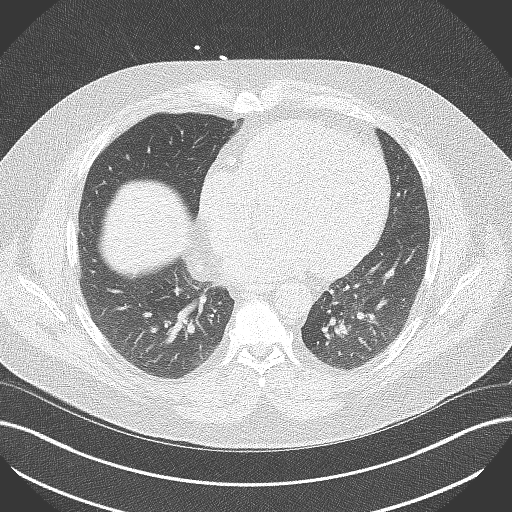
[im 28/42  lung]
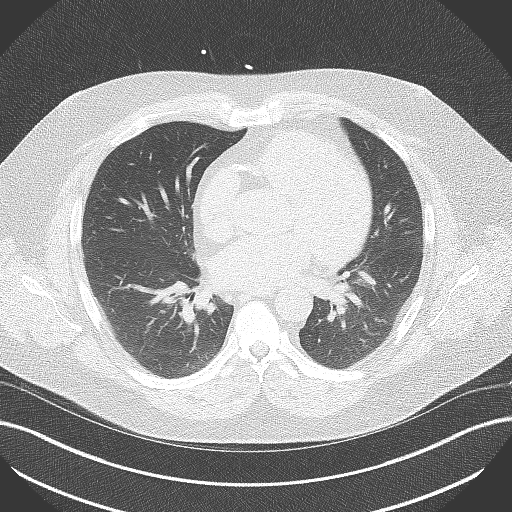
[im 35/42  lung]
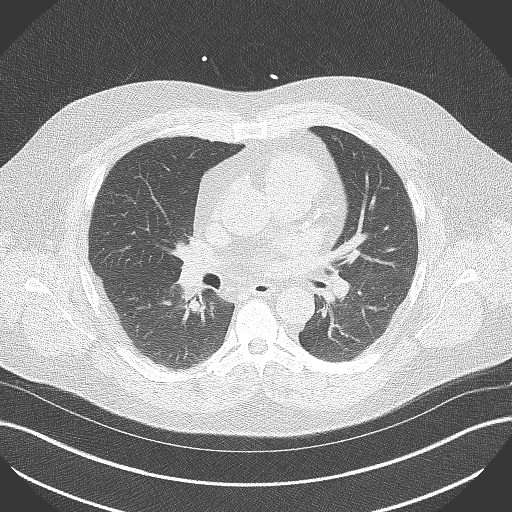

[Series 4: lung st 73 % · axial · 0.79mm/px · z∈[-240,-177]mm · 4 of 42 slices shown]
[im 7/42  lung]
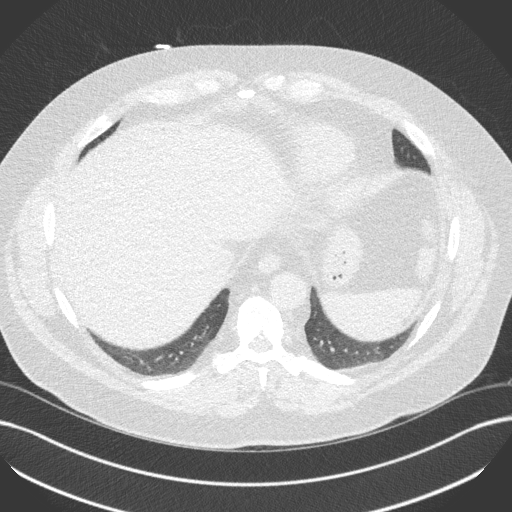
[im 14/42  lung]
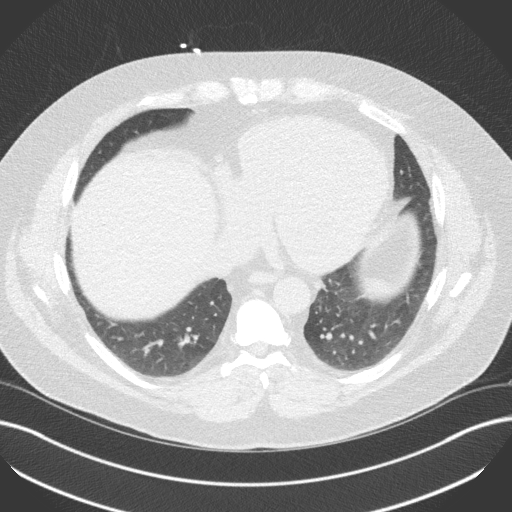
[im 21/42  lung]
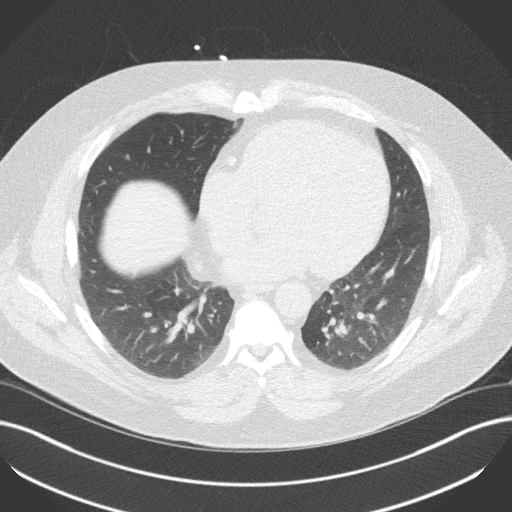
[im 28/42  lung]
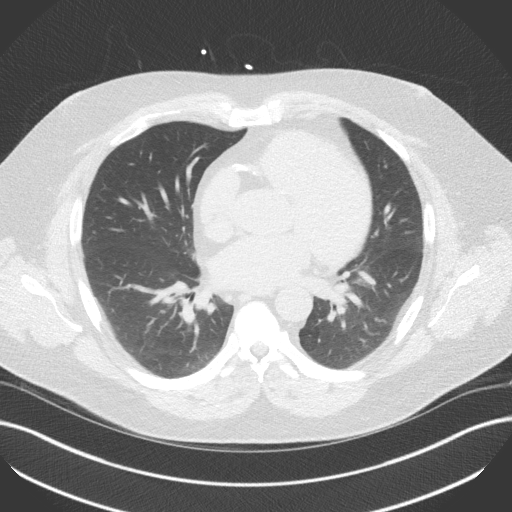

[13 of 20 positions shown; findings below may reference images not displayed]

FINDINGS: Within the visualized portions of the thorax there are no suspicious
appearing pulmonary nodules or masses, there is no acute
consolidative airspace disease, no pleural effusions, no
pneumothorax and no lymphadenopathy. Visualized portions of the
upper abdomen are unremarkable. There are no aggressive appearing
lytic or blastic lesions noted in the visualized portions of the
skeleton.
IMPRESSION: 1. No significant incidental noncardiac findings are noted.
FINDINGS: Coronary arteries: Normal origins.

Coronary Calcium Score:

Left main: 0

Left anterior descending artery: 123

Left circumflex artery: 35

Right coronary artery: 808

Total: 966

Percentile: 99th

Pericardium: Normal.

Ascending Aorta: Normal caliber.

Non-cardiac: See separate report from [REDACTED]. Dilated
main pulmonary artery, suggestive of pulmonary hypertension. Aortic
atherosclerosis.
IMPRESSION: Coronary calcium score of 966. This was 99th percentile for age-,
race-, and sex-matched controls. Heavy calcification throughout a
tortuous RCA and in the LAD.

Dilated main pulmonary artery suggestive of pulmonary hypertension.

Aortic atherosclerosis.



If CAC=0, it is reasonable to withhold statin therapy and reassess
in 5 to 10 years, as long as higher risk conditions are absent
(diabetes mellitus, family history of premature CHD in first degree
relatives (males <55 years; females <65 years), cigarette smoking,
or LDL >=190 mg/dL).

If CAC is 1 to 99, it is reasonable to initiate statin therapy for
patients >=55 years of age.

If CAC is >=100 or >=75th percentile, it is reasonable to initiate
statin therapy at any age.

Cardiology referral should be considered for patients with CAC
scores >=400 or >=75th percentile.

*3832 AHA/ACC/AACVPR/AAPA/ABC/NICIEJEWSKA/GIORGI/BAAISI/[place]ou/HYRUM/ZIMBA/DE LA RIVA
Guideline on the Management of Blood Cholesterol: A Report of the
American College of Cardiology/American Heart Association Task Force
on Clinical Practice Guidelines. J Am Coll Cardiol.
1344;73(24):4619-4383.
FINDINGS: Non-cardiac: See separate report from [REDACTED].

Ascending Aorta:

Pericardium: Normal

Coronary arteries:
IMPRESSION: Coronary calcium score of 971. This was 99 percentile for age and
sex matched control.

*** End of Addendum ***
Addendum:
EXAM:
OVER-READ INTERPRETATION  CT CHEST

The following report is an over-read performed by radiologist Dr.
Ashmi Kusain [REDACTED] on 04/06/2020. This
over-read does not include interpretation of cardiac or coronary
anatomy or pathology. The coronary calcium score/coronary CTA
interpretation by the cardiologist is attached.
FINDINGS: Within the visualized portions of the thorax there are no suspicious
appearing pulmonary nodules or masses, there is no acute
consolidative airspace disease, no pleural effusions, no
pneumothorax and no lymphadenopathy. Visualized portions of the
upper abdomen are unremarkable. There are no aggressive appearing
lytic or blastic lesions noted in the visualized portions of the
skeleton.
IMPRESSION: 1. No significant incidental noncardiac findings are noted.
FINDINGS: Coronary arteries: Normal origins.

Coronary Calcium Score:

Left main: 0

Left anterior descending artery: 123

Left circumflex artery: 35

Right coronary artery: 808

Total: 966

Percentile: 99th

Pericardium: Normal.

Ascending Aorta: Normal caliber.

Non-cardiac: See separate report from [REDACTED]. Dilated
main pulmonary artery, suggestive of pulmonary hypertension. Aortic
atherosclerosis.
IMPRESSION: Coronary calcium score of 966. This was 99th percentile for age-,
race-, and sex-matched controls. Heavy calcification throughout a
tortuous RCA and in the LAD.

Dilated main pulmonary artery suggestive of pulmonary hypertension.

Aortic atherosclerosis.



If CAC=0, it is reasonable to withhold statin therapy and reassess
in 5 to 10 years, as long as higher risk conditions are absent
(diabetes mellitus, family history of premature CHD in first degree
relatives (males <55 years; females <65 years), cigarette smoking,
or LDL >=190 mg/dL).

If CAC is 1 to 99, it is reasonable to initiate statin therapy for
patients >=55 years of age.

If CAC is >=100 or >=75th percentile, it is reasonable to initiate
statin therapy at any age.

Cardiology referral should be considered for patients with CAC
scores >=400 or >=75th percentile.

*3832 AHA/ACC/AACVPR/AAPA/ABC/NICIEJEWSKA/GIORGI/BAAISI/Mou/HYRUM/ZIMBA/DE LA RIVA
Guideline on the Management of Blood Cholesterol: A Report of the
American College of Cardiology/American Heart Association Task Force
on Clinical Practice Guidelines. J Am Coll Cardiol.
1344;73(24):4619-4383.

*** End of Addendum ***
EXAM:
OVER-READ INTERPRETATION  CT CHEST

The following report is an over-read performed by radiologist Dr.
Ashmi Kusain [REDACTED] on 04/06/2020. This
over-read does not include interpretation of cardiac or coronary
anatomy or pathology. The coronary calcium score/coronary CTA
interpretation by the cardiologist is attached.
FINDINGS: Within the visualized portions of the thorax there are no suspicious
appearing pulmonary nodules or masses, there is no acute
consolidative airspace disease, no pleural effusions, no
pneumothorax and no lymphadenopathy. Visualized portions of the
upper abdomen are unremarkable. There are no aggressive appearing
lytic or blastic lesions noted in the visualized portions of the
skeleton.
IMPRESSION: 1. No significant incidental noncardiac findings are noted.

## 2021-06-26 DIAGNOSIS — Q120 Congenital cataract: Secondary | ICD-10-CM | POA: Diagnosis not present

## 2021-07-02 DIAGNOSIS — G4733 Obstructive sleep apnea (adult) (pediatric): Secondary | ICD-10-CM | POA: Diagnosis not present

## 2021-08-01 DIAGNOSIS — G4733 Obstructive sleep apnea (adult) (pediatric): Secondary | ICD-10-CM | POA: Diagnosis not present

## 2021-09-01 DIAGNOSIS — G4733 Obstructive sleep apnea (adult) (pediatric): Secondary | ICD-10-CM | POA: Diagnosis not present

## 2021-10-01 DIAGNOSIS — G4733 Obstructive sleep apnea (adult) (pediatric): Secondary | ICD-10-CM | POA: Diagnosis not present

## 2021-10-02 DIAGNOSIS — G4733 Obstructive sleep apnea (adult) (pediatric): Secondary | ICD-10-CM | POA: Diagnosis not present

## 2021-10-31 DIAGNOSIS — G4733 Obstructive sleep apnea (adult) (pediatric): Secondary | ICD-10-CM | POA: Diagnosis not present

## 2021-12-01 DIAGNOSIS — G4733 Obstructive sleep apnea (adult) (pediatric): Secondary | ICD-10-CM | POA: Diagnosis not present

## 2022-01-15 DIAGNOSIS — G4733 Obstructive sleep apnea (adult) (pediatric): Secondary | ICD-10-CM | POA: Diagnosis not present

## 2022-01-22 DIAGNOSIS — Z0000 Encounter for general adult medical examination without abnormal findings: Secondary | ICD-10-CM | POA: Diagnosis not present

## 2022-01-22 DIAGNOSIS — Z125 Encounter for screening for malignant neoplasm of prostate: Secondary | ICD-10-CM | POA: Diagnosis not present

## 2022-01-22 DIAGNOSIS — I2510 Atherosclerotic heart disease of native coronary artery without angina pectoris: Secondary | ICD-10-CM | POA: Diagnosis not present

## 2022-01-22 DIAGNOSIS — Z6838 Body mass index (BMI) 38.0-38.9, adult: Secondary | ICD-10-CM | POA: Diagnosis not present

## 2022-02-15 DIAGNOSIS — G4733 Obstructive sleep apnea (adult) (pediatric): Secondary | ICD-10-CM | POA: Diagnosis not present

## 2022-03-16 DIAGNOSIS — G4733 Obstructive sleep apnea (adult) (pediatric): Secondary | ICD-10-CM | POA: Diagnosis not present

## 2022-06-27 DIAGNOSIS — E782 Mixed hyperlipidemia: Secondary | ICD-10-CM | POA: Diagnosis not present

## 2022-06-27 DIAGNOSIS — R809 Proteinuria, unspecified: Secondary | ICD-10-CM | POA: Diagnosis not present

## 2022-06-27 DIAGNOSIS — E1129 Type 2 diabetes mellitus with other diabetic kidney complication: Secondary | ICD-10-CM | POA: Diagnosis not present

## 2022-06-27 DIAGNOSIS — I10 Essential (primary) hypertension: Secondary | ICD-10-CM | POA: Diagnosis not present

## 2022-07-04 ENCOUNTER — Encounter: Payer: Self-pay | Admitting: Cardiology

## 2022-07-28 NOTE — Progress Notes (Unsigned)
Cardiology Office Note:  .   Date:  07/29/2022  ID:  Zebedee Iba, DOB 04-Jun-1965, MRN [id number] PCP: Noni Saupe, MD  [facility] Providers Cardiologist:  Gypsy Balsam, MD    History of Present Illness: .   Dan Miller is a 57 y.o. male with a past medical history of hypertension, CAD, ascending aortic aneurysm, OSA, dyslipidemia, morbid obesity.  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 04/06/2020 calcium score of of 971 placing him in the 90th percentile  Most recently evaluated by Dr. Bing Matter on 04/12/2020, he was doing well at this time from a cardiac perspective, trying to make lifestyle changes.  At that time it was noted that his ascending aorta was dilated at 43 mm however his wife had just changed her job and there were some issues with insurance at that time.  He presents today for follow-up of his CAD and aortic dilatation.  He has been doing well from a cardiac perspective, he has been actively trying to work out and lose weight with help with of Ozempic.  Unfortunately he is having to pay out-of-pocket and is very hard for him to afford this.  He is trying to increase his physical activity, walking every other day of the week for least 30 minutes. He denies chest pain, palpitations, dyspnea, pnd, orthopnea, n, v, dizziness, syncope, edema, weight gain, or early satiety.   CT without of chest, abdominal ultrasound  ROS: Review of Systems  All other systems reviewed and are negative.    Studies Reviewed: .        Cardiac Studies & Procedures       ECHOCARDIOGRAM  ECHOCARDIOGRAM COMPLETE 04/10/2020  Narrative ECHOCARDIOGRAM REPORT    Patient Name:   CARMEN VALLECILLO Date of Exam: 04/10/2020 Medical Rec #:  [id number]        Height:       75.0 in Accession #:    [id number]       Weight:       287.0 lb Date of Birth:  Aug 13, 1965        BSA:          2.558 m Patient Age:    54 years          BP:           126/78 mmHg Patient Gender: M                HR:           55 bpm. Exam Location:  [place]  Procedure: 2D Echo  Indications:    Essential hypertension [I10]  History:        Patient has no prior history of Echocardiogram examinations. Risk Factors:Dyslipidemia.  Sonographer:    Louie Boston Referring Phys: [phone] ROBERT J KRASOWSKI  IMPRESSIONS   1. Left ventricular ejection fraction, by estimation, is 50 to 55%. The left ventricle has low normal function. The left ventricle has no regional wall motion abnormalities. There is mild concentric left ventricular hypertrophy. Left ventricular diastolic parameters are consistent with Grade II diastolic dysfunction (pseudonormalization). The average left ventricular global longitudinal strain is -10.0 %. The global longitudinal strain is abnormal. 2. Right ventricular systolic function is normal. The right ventricular size is normal. There is normal pulmonary artery systolic pressure. 3. The mitral valve is normal in structure. No evidence of mitral valve regurgitation. No evidence of mitral stenosis. 4.  The aortic valve is normal in structure. Aortic valve regurgitation is not visualized. No aortic stenosis is present. 5. Aneurysm of the aortic root, measuring 43 mm. 6. The inferior vena cava is normal in size with greater than 50% respiratory variability, suggesting right atrial pressure of 3 mmHg.  FINDINGS Left Ventricle: Left ventricular ejection fraction, by estimation, is 50 to 55%. The left ventricle has low normal function. The left ventricle has no regional wall motion abnormalities. The average left ventricular global longitudinal strain is -10.0 %. The global longitudinal strain is abnormal. The left ventricular internal cavity size was normal in size. There is mild concentric left ventricular hypertrophy. Left ventricular diastolic parameters are consistent with Grade II diastolic  dysfunction (pseudonormalization).  Right Ventricle: The right ventricular size is normal. No increase in right ventricular wall thickness. Right ventricular systolic function is normal. There is normal pulmonary artery systolic pressure. The tricuspid regurgitant velocity is 2.06 m/s, and with an assumed right atrial pressure of 3 mmHg, the estimated right ventricular systolic pressure is 20.0 mmHg.  Left Atrium: Left atrial size was normal in size.  Right Atrium: Right atrial size was normal in size.  Pericardium: There is no evidence of pericardial effusion. Presence of pericardial fat pad.  Mitral Valve: The mitral valve is normal in structure. No evidence of mitral valve regurgitation. No evidence of mitral valve stenosis.  Tricuspid Valve: The tricuspid valve is normal in structure. Tricuspid valve regurgitation is trivial. No evidence of tricuspid stenosis.  Aortic Valve: The aortic valve is normal in structure. Aortic valve regurgitation is not visualized. No aortic stenosis is present.  Pulmonic Valve: The pulmonic valve was normal in structure. Pulmonic valve regurgitation is not visualized. No evidence of pulmonic stenosis.  Aorta: Aortic dilatation noted. There is an aneurysm involving the aortic root measuring 43 mm.  Venous: The inferior vena cava is normal in size with greater than 50% respiratory variability, suggesting right atrial pressure of 3 mmHg.  IAS/Shunts: No atrial level shunt detected by color flow Doppler.   LEFT VENTRICLE PLAX 2D LVIDd:         5.60 cm      Diastology LVIDs:         3.80 cm      LV e' medial:    6.64 cm/s LV PW:         1.20 cm      LV E/e' medial:  12.4 LV IVS:        1.20 cm      LV e' lateral:   11.20 cm/s LVOT diam:     2.20 cm      LV E/e' lateral: 7.3 LV SV:         78 LV SV Index:   30           2D Longitudinal Strain LVOT Area:     3.80 cm     2D Strain GLS Avg:     -10.0 %  LV Volumes (MOD) LV vol d, MOD A4C: 101.0 ml LV  vol s, MOD A4C: 45.8 ml LV SV MOD A4C:     101.0 ml  RIGHT VENTRICLE             IVC RV S prime:     11.70 cm/s  IVC diam: 1.40 cm TAPSE (M-mode): 2.4 cm  LEFT ATRIUM           Index       RIGHT ATRIUM  Index LA diam:      4.30 cm 1.68 cm/m  RA Area:     14.70 cm LA Vol (A2C): 49.9 ml 19.50 ml/m RA Volume:   31.50 ml  12.31 ml/m LA Vol (A4C): 32.4 ml 12.66 ml/m AORTIC VALVE LVOT Vmax:   81.60 cm/s LVOT Vmean:  58.100 cm/s LVOT VTI:    0.204 m  AORTA Ao Root diam:  3.20 cm Ao Sinus diam: 4.30 cm Ao STJ diam:   3.5 cm Ao Asc diam:   3.85 cm Ao Desc diam:  2.70 cm  MITRAL VALVE               TRICUSPID VALVE MV Area (PHT): 3.65 cm    TR Peak grad:   17.0 mmHg MV Decel Time: 208 msec    TR Vmax:        206.00 cm/s MV E velocity: 82.30 cm/s MV A velocity: 56.10 cm/s  SHUNTS MV E/A ratio:  1.47        Systemic VTI:  0.20 m Systemic Diam: 2.20 cm  Kardie Tobb DO Electronically signed by Thomasene Ripple DO Signature Date/Time: 04/10/2020/8:40:49 PM    Final     CT SCANS  CT CARDIAC SCORING (SELF PAY ONLY) 04/06/2020  Addendum 04/07/2020 10:32 AM ADDENDUM REPORT: 04/06/2020 17:40  CLINICAL DATA:  Risk stratification  EXAM: Coronary Calcium Score  TECHNIQUE: The patient was scanned on a [employer] scanner. Axial non-contrast 3 mm slices were carried out through the heart. The data set was analyzed on a dedicated work station and scored using the Agatson method.  FINDINGS: Non-cardiac: See separate report from [facility].  Ascending Aorta:  Pericardium: Normal  Coronary arteries:  IMPRESSION: Coronary calcium score of 971. This was 74 percentile for age and sex matched control.  Georgeanna Lea, MD   Electronically Signed By: Gypsy Balsam On: 04/06/2020 17:40  Addendum 04/06/2020 12:06 PM ADDENDUM REPORT: 04/06/2020 12:04  CLINICAL DATA:  Cardiovascular Disease Risk stratification  EXAM: Coronary Calcium  Score  TECHNIQUE: A gated, non-contrast computed tomography scan of the heart was performed using 3mm slice thickness. Axial images were analyzed on a dedicated workstation. Calcium scoring of the coronary arteries was performed using the Agatston method.  FINDINGS: Coronary arteries: Normal origins.  Coronary Calcium Score:  Left main: 0  Left anterior descending artery: 123  Left circumflex artery: 35  Right coronary artery: 808  Total: 966  Percentile: 99th  Pericardium: Normal.  Ascending Aorta: Normal caliber.  Non-cardiac: See separate report from [facility]. Dilated main pulmonary artery, suggestive of pulmonary hypertension. Aortic atherosclerosis.  IMPRESSION: Coronary calcium score of 966. This was 99th percentile for age-, race-, and sex-matched controls. Heavy calcification throughout a tortuous RCA and in the LAD.  Dilated main pulmonary artery suggestive of pulmonary hypertension.  Aortic atherosclerosis.  RECOMMENDATIONS: Coronary artery calcium (CAC) score is a strong predictor of incident coronary heart disease (CHD) and provides predictive information beyond traditional risk factors. CAC scoring is reasonable to use in the decision to withhold, postpone, or initiate statin therapy in intermediate-risk or selected borderline-risk asymptomatic adults (age 17-75 years and LDL-C >=70 to <190 mg/dL) who do not have diabetes or established atherosclerotic cardiovascular disease (ASCVD).* In intermediate-risk (10-year ASCVD risk >=7.5% to <20%) adults or selected borderline-risk (10-year ASCVD risk >=5% to <7.5%) adults in whom a CAC score is measured for the purpose of making a treatment decision the following recommendations have been made:  If CAC=0, it is reasonable to  withhold statin therapy and reassess in 5 to 10 years, as long as higher risk conditions are absent (diabetes mellitus, family history of premature CHD in first  degree relatives (males <55 years; females <65 years), cigarette smoking, or LDL >=190 mg/dL).  If CAC is 1 to 99, it is reasonable to initiate statin therapy for patients >=78 years of age.  If CAC is >=100 or >=75th percentile, it is reasonable to initiate statin therapy at any age.  Cardiology referral should be considered for patients with CAC scores >=400 or >=75th percentile.  *2018 AHA/ACC/AACVPR/AAPA/ABC/ACPM/ADA/AGS/APhA/ASPC/NLA/PCNA Guideline on the Management of Blood Cholesterol: A Report of the American College of Cardiology/American Heart Association Task Force on Clinical Practice Guidelines. J Am Coll Cardiol. 2019;73(24):3168-3209.  Zoila Shutter, MD   Electronically Signed By: Chrystie Nose M.D. On: 04/06/2020 12:04  Narrative EXAM: OVER-READ INTERPRETATION  CT CHEST  The following report is an over-read performed by radiologist Dr. Trudie Reed of [facility] on 04/06/2020. This over-read does not include interpretation of cardiac or coronary anatomy or pathology. The coronary calcium score/coronary CTA interpretation by the cardiologist is attached.  COMPARISON:  None.  FINDINGS: Within the visualized portions of the thorax there are no suspicious appearing pulmonary nodules or masses, there is no acute consolidative airspace disease, no pleural effusions, no pneumothorax and no lymphadenopathy. Visualized portions of the upper abdomen are unremarkable. There are no aggressive appearing lytic or blastic lesions noted in the visualized portions of the skeleton.  IMPRESSION: 1. No significant incidental noncardiac findings are noted.  Electronically Signed: By: Trudie Reed M.D. On: 04/06/2020 10:01          Risk Assessment/Calculations:     HYPERTENSION CONTROL Vitals:   07/29/22 1124 07/29/22 1205  BP: (!) 140/80 (!) 140/80    The patient's blood pressure is elevated above target today.  In order to address the  patient's elevated BP: Blood pressure will be monitored at home to determine if medication changes need to be made.          Physical Exam:   VS:  BP (!) 140/80   Pulse 68   Ht 6\' 2"  (1.88 m)   Wt 260 lb 6.4 oz (118.1 kg)   SpO2 97%   BMI 33.43 kg/m    Wt Readings from Last 3 Encounters:  07/29/22 260 lb 6.4 oz (118.1 kg)  04/12/20 276 lb (125.2 kg)  03/09/20 287 lb (130.2 kg)    GEN: Well nourished, well developed in no acute distress NECK: No JVD; No carotid bruits CARDIAC: RRR, no murmurs, rubs, gallops RESPIRATORY:  Clear to auscultation without rales, wheezing or rhonchi  ABDOMEN: Soft, non-tender, non-distended EXTREMITIES:  No edema; No deformity   ASSESSMENT AND PLAN: .   Coronary artery disease - calcium score of of 971 placing him in the 90th percentile. Stable with no anginal symptoms. No indication for ischemic evaluation.  He is trying to augment his risk of future coronary events by losing weight. He was on Ozempic, but was paying out of pocket and has not been able to get it recently. Will start ASA 81 mg daily. Currently on Crestor 5 mg daily, will increase per below.  Obesity - BMI 34, he had initial success with Ozempic, however has not been able to lose anymore weight and it has become cost-prohibitive. He has tried diet and exercise, has not been able to lose > 5% of his body weight alone. Will send in [facility]  AAA - noted  on echo, will arrange for CTA of the chest and abdominal ultrasound for surveillance. This is a concern as he has a very elevated calcium score, and obesity directly plays into this.  HTN - BP is elevated in the office today, however he was rushing to get here and stated it was in the 120's systolic at his recent physical.  HLD - most recent LDL in June of 2024 elevated at 99, will increase Crestor to 10 mg daily. Repeat FLP and FLTs in 8 weeks. May have had some back pain when he initially started, if this persists/worsens with increased dosing,  will consider other lipid lowering agents.        Dispo: Start Wegovy, increase Crestor to 10 mg daily, start ASA 81 mg daily, repeat FLP and FLTs in 8 weeks. CT chest and [facility] [place].   Signed, Flossie Dibble, NP

## 2022-07-29 ENCOUNTER — Ambulatory Visit: Payer: BLUE CROSS/BLUE SHIELD | Attending: Cardiology | Admitting: Cardiology

## 2022-07-29 ENCOUNTER — Encounter: Payer: Self-pay | Admitting: Cardiology

## 2022-07-29 VITALS — BP 140/80 | HR 68 | Ht 74.0 in | Wt 260.4 lb

## 2022-07-29 DIAGNOSIS — I2510 Atherosclerotic heart disease of native coronary artery without angina pectoris: Secondary | ICD-10-CM

## 2022-07-29 DIAGNOSIS — E782 Mixed hyperlipidemia: Secondary | ICD-10-CM

## 2022-07-29 DIAGNOSIS — E669 Obesity, unspecified: Secondary | ICD-10-CM

## 2022-07-29 DIAGNOSIS — E66811 Obesity, class 1: Secondary | ICD-10-CM

## 2022-07-29 DIAGNOSIS — I10 Essential (primary) hypertension: Secondary | ICD-10-CM

## 2022-07-29 DIAGNOSIS — I7121 Aneurysm of the ascending aorta, without rupture: Secondary | ICD-10-CM

## 2022-07-29 MED ORDER — SEMAGLUTIDE-WEIGHT MANAGEMENT 1.7 MG/0.75ML ~~LOC~~ SOAJ: 1.7000 mg | SUBCUTANEOUS | 0 refills | Status: AC

## 2022-07-29 MED ORDER — SEMAGLUTIDE-WEIGHT MANAGEMENT 0.5 MG/0.5ML ~~LOC~~ SOAJ: 0.5000 mg | SUBCUTANEOUS | 0 refills | Status: AC

## 2022-07-29 MED ORDER — SEMAGLUTIDE-WEIGHT MANAGEMENT 0.25 MG/0.5ML ~~LOC~~ SOAJ: 0.2500 mg | SUBCUTANEOUS | 0 refills | Status: AC

## 2022-07-29 MED ORDER — SEMAGLUTIDE-WEIGHT MANAGEMENT 1 MG/0.5ML ~~LOC~~ SOAJ: 1.0000 mg | SUBCUTANEOUS | 0 refills | Status: AC

## 2022-07-29 MED ORDER — SEMAGLUTIDE-WEIGHT MANAGEMENT 2.4 MG/0.75ML ~~LOC~~ SOAJ: 2.4000 mg | SUBCUTANEOUS | 0 refills | Status: AC

## 2022-07-29 MED ORDER — ROSUVASTATIN CALCIUM 10 MG PO TABS: 10.0000 mg | ORAL_TABLET | Freq: Every day | ORAL | 3 refills | Status: DC

## 2022-07-29 NOTE — Patient Instructions (Signed)
Medication Instructions:  Your physician has recommended you make the following change in your medication:  Start Aspirin 81 mg once daily Increase Crestor to 10 mg once in evening Start Wegovy for weight loss  *If you need a refill on your cardiac medications before your next appointment, please call your pharmacy*   Lab Work: Your physician recommends that you return for lab work in: 8 Weeks for Fasting Lipid Profile and Liver Panel Lab opens at 8am. You DO NOT NEED an appointment. Best time to come is between 8am and 12noon and between 1:30 and 4:30. If you have been asked to fast for your blood work please have nothing to eat or drink after midnight. You may have water.   If you have labs (blood work) drawn today and your tests are completely normal, you will receive your results only by: MyChart Message (if you have MyChart) OR A paper copy in the mail If you have any lab test that is abnormal or we need to change your treatment, we will call you to review the results.   Testing/Procedures: Non-Cardiac CT scanning, (CAT scanning), is a noninvasive, special x-ray that produces cross-sectional images of the body using x-rays and a computer. CT scans help physicians diagnose and treat medical conditions. For some CT exams, a contrast material is used to enhance visibility in the area of the body being studied. CT scans provide greater clarity and reveal more details than regular x-ray exams.  Your physician has requested that you have an abdominal aorta duplex. During this test, an ultrasound is used to evaluate the aorta. Allow 30 minutes for this exam. Do not eat after midnight the day before and avoid carbonated beverages    Follow-Up: At [facility], you and your health needs are our priority.  As part of our continuing mission to provide you with exceptional heart care, we have created designated Provider Care Teams.  These Care Teams include your primary Cardiologist  (physician) and Advanced Practice Providers (APPs -  Physician Assistants and Nurse Practitioners) who all work together to provide you with the care you need, when you need it.  We recommend signing up for the patient portal called "MyChart".  Sign up information is provided on this After Visit Summary.  MyChart is used to connect with patients for Virtual Visits (Telemedicine).  Patients are able to view lab/test results, encounter notes, upcoming appointments, etc.  Non-urgent messages can be sent to your provider as well.   To learn more about what you can do with MyChart, go to [email or web address].    Your next appointment:   1 year(s)  Provider:   Gypsy Balsam, MD    Other Instructions

## 2022-08-01 ENCOUNTER — Telehealth: Payer: Self-pay | Admitting: *Deleted

## 2022-08-01 NOTE — Telephone Encounter (Signed)
Please precert ZOXWRU 0.25MG /0.5ML INJ (4 PENS) for pt. Pt ID # is [id number] Thank you

## 2022-08-05 ENCOUNTER — Telehealth: Payer: Self-pay

## 2022-08-05 ENCOUNTER — Other Ambulatory Visit (HOSPITAL_COMMUNITY): Payer: Self-pay

## 2022-08-05 NOTE — Telephone Encounter (Signed)
Pharmacy Patient Advocate Encounter   Received notification from Physician's Office that prior authorization for [facility] is required/requested.   Insurance verification completed.   The patient is insured through [employer] .   Per test claim: PA required; PA submitted to [facility] AVAIL via Fax/EMAIL  (Status is pending)  F)[phone] [phone]  RX benefits info  BIN: [id number] PCN; PV ID: [id number] GRP: [id number]

## 2022-08-07 NOTE — Telephone Encounter (Signed)
Pharmacy Patient Advocate Encounter   Received notification from  [facility] AVAIL  that Prior Authorization for [facility] has been  APPROVED FROM 7.29.24 TO 1.15.25

## 2022-08-07 NOTE — Telephone Encounter (Addendum)
Pharmacy Patient Advocate Encounter   Received notification from  [facility] AVAIL  that Prior Authorization for [facility] has been  APPROVED FROM 7.29.24 TO 1.15.25

## 2022-08-13 ENCOUNTER — Ambulatory Visit (HOSPITAL_COMMUNITY)
Admission: RE | Admit: 2022-08-13 | Discharge: 2022-08-13 | Disposition: A | Discharge status: Home / Self Care | Payer: BC Managed Care – PPO | Source: Ambulatory Visit | Attending: Cardiology | Admitting: Cardiology

## 2022-08-13 DIAGNOSIS — I7121 Aneurysm of the ascending aorta, without rupture: Secondary | ICD-10-CM | POA: Diagnosis not present

## 2022-08-13 DIAGNOSIS — I10 Essential (primary) hypertension: Secondary | ICD-10-CM | POA: Insufficient documentation

## 2022-08-13 DIAGNOSIS — I2510 Atherosclerotic heart disease of native coronary artery without angina pectoris: Secondary | ICD-10-CM | POA: Diagnosis not present

## 2022-08-14 ENCOUNTER — Ambulatory Visit (HOSPITAL_BASED_OUTPATIENT_CLINIC_OR_DEPARTMENT_OTHER)
Admission: RE | Admit: 2022-08-14 | Discharge: 2022-08-14 | Disposition: A | Discharge status: Home / Self Care | Payer: BC Managed Care – PPO | Source: Ambulatory Visit | Attending: Cardiology | Admitting: Cardiology

## 2022-08-14 DIAGNOSIS — I10 Essential (primary) hypertension: Secondary | ICD-10-CM | POA: Diagnosis not present

## 2022-08-14 DIAGNOSIS — I2510 Atherosclerotic heart disease of native coronary artery without angina pectoris: Secondary | ICD-10-CM | POA: Diagnosis not present

## 2022-08-14 DIAGNOSIS — I7121 Aneurysm of the ascending aorta, without rupture: Secondary | ICD-10-CM | POA: Insufficient documentation

## 2022-08-14 DIAGNOSIS — I700 Atherosclerosis of aorta: Secondary | ICD-10-CM | POA: Diagnosis not present

## 2022-08-16 ENCOUNTER — Telehealth: Payer: Self-pay

## 2022-08-16 NOTE — Telephone Encounter (Signed)
Patient notified through my chart.

## 2022-08-16 NOTE — Telephone Encounter (Signed)
-----   Message from Flossie Dibble sent at 08/16/2022  8:20 AM EDT ----- Abdominal US without evidence of aneurysm. Good result.

## 2022-08-30 DIAGNOSIS — G4733 Obstructive sleep apnea (adult) (pediatric): Secondary | ICD-10-CM | POA: Diagnosis not present

## 2022-09-30 DIAGNOSIS — G4733 Obstructive sleep apnea (adult) (pediatric): Secondary | ICD-10-CM | POA: Diagnosis not present

## 2022-10-02 DIAGNOSIS — Z23 Encounter for immunization: Secondary | ICD-10-CM | POA: Diagnosis not present

## 2022-10-02 DIAGNOSIS — I25118 Atherosclerotic heart disease of native coronary artery with other forms of angina pectoris: Secondary | ICD-10-CM | POA: Diagnosis not present

## 2022-10-02 DIAGNOSIS — I10 Essential (primary) hypertension: Secondary | ICD-10-CM | POA: Diagnosis not present

## 2022-10-23 ENCOUNTER — Telehealth: Payer: Self-pay | Admitting: *Deleted

## 2022-10-23 DIAGNOSIS — I7121 Aneurysm of the ascending aorta, without rupture: Secondary | ICD-10-CM | POA: Diagnosis not present

## 2022-10-23 DIAGNOSIS — I2510 Atherosclerotic heart disease of native coronary artery without angina pectoris: Secondary | ICD-10-CM | POA: Diagnosis not present

## 2022-10-23 DIAGNOSIS — I10 Essential (primary) hypertension: Secondary | ICD-10-CM | POA: Diagnosis not present

## 2022-10-23 LAB — LIPID PANEL
Chol/HDL Ratio: 3 ratio (ref 0.0–5.0)
Cholesterol, Total: 159 mg/dL (ref 100–199)
HDL: 53 mg/dL
LDL Chol Calc (NIH): 85 mg/dL (ref 0–99)
Triglycerides: 119 mg/dL (ref 0–149)
VLDL Cholesterol Cal: 21 mg/dL (ref 5–40)

## 2022-10-23 LAB — HEPATIC FUNCTION PANEL
ALT: 28 IU/L (ref 0–44)
AST: 27 IU/L (ref 0–40)
Albumin: 4.8 g/dL (ref 3.8–4.9)
Alkaline Phosphatase: 86 IU/L (ref 44–121)
Bilirubin Total: 0.4 mg/dL (ref 0.0–1.2)
Bilirubin, Direct: 0.13 mg/dL (ref 0.00–0.40)
Total Protein: 7.7 g/dL (ref 6.0–8.5)

## 2022-10-23 MED ORDER — ROSUVASTATIN CALCIUM 10 MG PO TABS: 10.0000 mg | ORAL_TABLET | Freq: Every day | ORAL | 3 refills | Status: DC

## 2022-10-23 NOTE — Telephone Encounter (Signed)
Rx refill sent to pharmacy. 

## 2022-10-24 ENCOUNTER — Other Ambulatory Visit: Payer: Self-pay

## 2022-10-24 DIAGNOSIS — E782 Mixed hyperlipidemia: Secondary | ICD-10-CM

## 2022-10-24 MED ORDER — ROSUVASTATIN CALCIUM 20 MG PO TABS: 20.0000 mg | ORAL_TABLET | Freq: Every day | ORAL | 3 refills | Status: DC

## 2022-10-30 DIAGNOSIS — G4733 Obstructive sleep apnea (adult) (pediatric): Secondary | ICD-10-CM | POA: Diagnosis not present

## 2023-01-06 ENCOUNTER — Telehealth: Payer: Self-pay | Admitting: Pharmacy Technician

## 2023-01-06 NOTE — Telephone Encounter (Signed)
Pharmacy Patient Advocate Encounter   Received notification from Fax that prior authorization for wegovy is required/requested.   Insurance verification completed.   The patient is insured through  [employer]  .   Per test claim: PA required; PA submitted to above mentioned insurance via Fax Key/confirmation #/EOC fax Status is pending

## 2023-01-07 ENCOUNTER — Telehealth: Payer: Self-pay | Admitting: Cardiology

## 2023-01-07 MED ORDER — WEGOVY 1 MG/0.5ML ~~LOC~~ SOAJ: 1.0000 mg | SUBCUTANEOUS | 0 refills | Status: DC

## 2023-01-07 NOTE — Telephone Encounter (Signed)
*  STAT* If patient is at the pharmacy, call can be transferred to refill team.   1. Which medications need to be refilled? (please list name of each medication and dose if known)  Semaglutide , 1 MG/DOSE, (OZEMPIC , 1 MG/DOSE,) 4 MG/3ML SOPN  2. Which pharmacy/location (including street and city if local pharmacy) is medication to be sent to? [facility], [place] - [address] AT [address]  3. Do they need a 30 day or 90 day supply?   30 day supply  Patient states he is completely out of medication.

## 2023-01-09 MED ORDER — WEGOVY 2.4 MG/0.75ML ~~LOC~~ SOAJ: 2.4000 mg | SUBCUTANEOUS | 5 refills | Status: DC

## 2023-01-09 NOTE — Addendum Note (Signed)
 Addended by: Tylene Fantasia on: 01/09/2023 02:44 PM   Modules accepted: Orders

## 2023-01-09 NOTE — Telephone Encounter (Signed)
 Patient is following up. He states the incorrect dose of Ozempic was sent in. He states it should have been increased to 2.4 MG. Please advise.

## 2023-01-10 ENCOUNTER — Other Ambulatory Visit (HOSPITAL_COMMUNITY): Payer: Self-pay

## 2023-01-13 NOTE — Telephone Encounter (Signed)
 Sent medical necessity letter with appeal- Denied due to lack of information. Asked to expedite

## 2023-01-14 ENCOUNTER — Other Ambulatory Visit (HOSPITAL_COMMUNITY): Payer: Self-pay

## 2023-01-16 NOTE — Telephone Encounter (Signed)
 Need updated chart notes which includes weight, BMI to submit to insurance- needs to be within last 3 months

## 2023-01-16 NOTE — Telephone Encounter (Signed)
 Spoke with Dan Miller about  reason of denial. He verbalized understanding. Appt made to see [name] to assess [facility] treatment plan.

## 2023-01-22 DIAGNOSIS — E782 Mixed hyperlipidemia: Secondary | ICD-10-CM | POA: Diagnosis not present

## 2023-01-22 NOTE — Progress Notes (Signed)
Cardiology Office Note:  .   Date:  01/23/2023  ID:  Dan Miller, DOB 1965/02/01, MRN [id number] PCP: Noni Saupe, MD  [facility] Providers Cardiologist:  Gypsy Balsam, MD    History of Present Illness: .   Dan Miller is a 58 y.o. male with a past medical history of hypertension, CAD, ascending aortic aneurysm, OSA, dyslipidemia, morbid obesity.  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 04/06/2020 calcium score of of 971 placing him in the 90th percentile  Most recently evaluated by Dr. Bing Matter on 04/12/2020, he was doing well at this time from a cardiac perspective, trying to make lifestyle changes.  At that time it was noted that his ascending aorta was dilated at 43 mm however his wife had just changed her job and there were some issues with insurance at that time.  Evaluated 07/29/2022  for follow-up of his CAD and aortic dilatation.  Was started on Wegovy, arrange for CT of the chest and AAA duplex for ascending aortic dilatation.  He presents today for follow-up of his CAD.  He has been doing well from a cardiac perspective, stays active at work, without any anginal complaints.  We reviewed his recent testing and detailed, he is understandably concerned about his coronary artery disease.  He has been slowly losing weight, he plans to increase his physical activity more to help with this. He denies chest pain, palpitations, dyspnea, pnd, orthopnea, n, v, dizziness, syncope, edema, weight gain, or early satiety.       ROS: Review of Systems  All other systems reviewed and are negative.    Studies Reviewed: .        Cardiac Studies & Procedures      ECHOCARDIOGRAM  ECHOCARDIOGRAM COMPLETE 04/10/2020  Narrative ECHOCARDIOGRAM REPORT    Patient Name:   Dan Miller Date of Exam: 04/10/2020 Medical Rec #:  [id number]        Height:       75.0 in Accession #:    [id number]       Weight:        287.0 lb Date of Birth:  06/05/1965        BSA:          2.558 m Patient Age:    54 years         BP:           126/78 mmHg Patient Gender: M                HR:           55 bpm. Exam Location:  [place]  Procedure: 2D Echo  Indications:    Essential hypertension [I10]  History:        Patient has no prior history of Echocardiogram examinations. Risk Factors:Dyslipidemia.  Sonographer:    Louie Boston Referring Phys: [phone] ROBERT J KRASOWSKI  IMPRESSIONS   1. Left ventricular ejection fraction, by estimation, is 50 to 55%. The left ventricle has low normal function. The left ventricle has no regional wall motion abnormalities. There is mild concentric left ventricular hypertrophy. Left ventricular diastolic parameters are consistent with Grade II diastolic dysfunction (pseudonormalization). The average left ventricular global longitudinal strain is -10.0 %. The global longitudinal strain is abnormal. 2. Right ventricular systolic function is normal. The right ventricular size is normal. There is normal pulmonary artery systolic pressure. 3. The mitral valve is normal  in structure. No evidence of mitral valve regurgitation. No evidence of mitral stenosis. 4. The aortic valve is normal in structure. Aortic valve regurgitation is not visualized. No aortic stenosis is present. 5. Aneurysm of the aortic root, measuring 43 mm. 6. The inferior vena cava is normal in size with greater than 50% respiratory variability, suggesting right atrial pressure of 3 mmHg.  FINDINGS Left Ventricle: Left ventricular ejection fraction, by estimation, is 50 to 55%. The left ventricle has low normal function. The left ventricle has no regional wall motion abnormalities. The average left ventricular global longitudinal strain is -10.0 %. The global longitudinal strain is abnormal. The left ventricular internal cavity size was normal in size. There is mild concentric left ventricular hypertrophy. Left ventricular  diastolic parameters are consistent with Grade II diastolic dysfunction (pseudonormalization).  Right Ventricle: The right ventricular size is normal. No increase in right ventricular wall thickness. Right ventricular systolic function is normal. There is normal pulmonary artery systolic pressure. The tricuspid regurgitant velocity is 2.06 m/s, and with an assumed right atrial pressure of 3 mmHg, the estimated right ventricular systolic pressure is 20.0 mmHg.  Left Atrium: Left atrial size was normal in size.  Right Atrium: Right atrial size was normal in size.  Pericardium: There is no evidence of pericardial effusion. Presence of pericardial fat pad.  Mitral Valve: The mitral valve is normal in structure. No evidence of mitral valve regurgitation. No evidence of mitral valve stenosis.  Tricuspid Valve: The tricuspid valve is normal in structure. Tricuspid valve regurgitation is trivial. No evidence of tricuspid stenosis.  Aortic Valve: The aortic valve is normal in structure. Aortic valve regurgitation is not visualized. No aortic stenosis is present.  Pulmonic Valve: The pulmonic valve was normal in structure. Pulmonic valve regurgitation is not visualized. No evidence of pulmonic stenosis.  Aorta: Aortic dilatation noted. There is an aneurysm involving the aortic root measuring 43 mm.  Venous: The inferior vena cava is normal in size with greater than 50% respiratory variability, suggesting right atrial pressure of 3 mmHg.  IAS/Shunts: No atrial level shunt detected by color flow Doppler.   LEFT VENTRICLE PLAX 2D LVIDd:         5.60 cm      Diastology LVIDs:         3.80 cm      LV e' medial:    6.64 cm/s LV PW:         1.20 cm      LV E/e' medial:  12.4 LV IVS:        1.20 cm      LV e' lateral:   11.20 cm/s LVOT diam:     2.20 cm      LV E/e' lateral: 7.3 LV SV:         78 LV SV Index:   30           2D Longitudinal Strain LVOT Area:     3.80 cm     2D Strain GLS Avg:      -10.0 %  LV Volumes (MOD) LV vol d, MOD A4C: 101.0 ml LV vol s, MOD A4C: 45.8 ml LV SV MOD A4C:     101.0 ml  RIGHT VENTRICLE             IVC RV S prime:     11.70 cm/s  IVC diam: 1.40 cm TAPSE (M-mode): 2.4 cm  LEFT ATRIUM           Index  RIGHT ATRIUM           Index LA diam:      4.30 cm 1.68 cm/m  RA Area:     14.70 cm LA Vol (A2C): 49.9 ml 19.50 ml/m RA Volume:   31.50 ml  12.31 ml/m LA Vol (A4C): 32.4 ml 12.66 ml/m AORTIC VALVE LVOT Vmax:   81.60 cm/s LVOT Vmean:  58.100 cm/s LVOT VTI:    0.204 m  AORTA Ao Root diam:  3.20 cm Ao Sinus diam: 4.30 cm Ao STJ diam:   3.5 cm Ao Asc diam:   3.85 cm Ao Desc diam:  2.70 cm  MITRAL VALVE               TRICUSPID VALVE MV Area (PHT): 3.65 cm    TR Peak grad:   17.0 mmHg MV Decel Time: 208 msec    TR Vmax:        206.00 cm/s MV E velocity: 82.30 cm/s MV A velocity: 56.10 cm/s  SHUNTS MV E/A ratio:  1.47        Systemic VTI:  0.20 m Systemic Diam: 2.20 cm  Kardie Tobb DO Electronically signed by Thomasene Ripple DO Signature Date/Time: 04/10/2020/8:40:49 PM    Final    CT SCANS  CT CARDIAC SCORING (SELF PAY ONLY) 04/06/2020  Addendum 04/07/2020 10:32 AM ADDENDUM REPORT: 04/06/2020 17:40  CLINICAL DATA:  Risk stratification  EXAM: Coronary Calcium Score  TECHNIQUE: The patient was scanned on a [employer] scanner. Axial non-contrast 3 mm slices were carried out through the heart. The data set was analyzed on a dedicated work station and scored using the Agatson method.  FINDINGS: Non-cardiac: See separate report from [facility].  Ascending Aorta:  Pericardium: Normal  Coronary arteries:  IMPRESSION: Coronary calcium score of 971. This was 66 percentile for age and sex matched control.  Georgeanna Lea, MD   Electronically Signed By: Gypsy Balsam On: 04/06/2020 17:40  Addendum 04/06/2020 12:06 PM ADDENDUM REPORT: 04/06/2020 12:04  CLINICAL DATA:  Cardiovascular Disease Risk  stratification  EXAM: Coronary Calcium Score  TECHNIQUE: A gated, non-contrast computed tomography scan of the heart was performed using 3mm slice thickness. Axial images were analyzed on a dedicated workstation. Calcium scoring of the coronary arteries was performed using the Agatston method.  FINDINGS: Coronary arteries: Normal origins.  Coronary Calcium Score:  Left main: 0  Left anterior descending artery: 123  Left circumflex artery: 35  Right coronary artery: 808  Total: 966  Percentile: 99th  Pericardium: Normal.  Ascending Aorta: Normal caliber.  Non-cardiac: See separate report from [facility]. Dilated main pulmonary artery, suggestive of pulmonary hypertension. Aortic atherosclerosis.  IMPRESSION: Coronary calcium score of 966. This was 99th percentile for age-, race-, and sex-matched controls. Heavy calcification throughout a tortuous RCA and in the LAD.  Dilated main pulmonary artery suggestive of pulmonary hypertension.  Aortic atherosclerosis.  RECOMMENDATIONS: Coronary artery calcium (CAC) score is a strong predictor of incident coronary heart disease (CHD) and provides predictive information beyond traditional risk factors. CAC scoring is reasonable to use in the decision to withhold, postpone, or initiate statin therapy in intermediate-risk or selected borderline-risk asymptomatic adults (age 86-75 years and LDL-C >=70 to <190 mg/dL) who do not have diabetes or established atherosclerotic cardiovascular disease (ASCVD).* In intermediate-risk (10-year ASCVD risk >=7.5% to <20%) adults or selected borderline-risk (10-year ASCVD risk >=5% to <7.5%) adults in whom a CAC score is measured for the purpose of making a treatment decision the following  recommendations have been made:  If CAC=0, it is reasonable to withhold statin therapy and reassess in 5 to 10 years, as long as higher risk conditions are absent (diabetes mellitus, family  history of premature CHD in first degree relatives (males <55 years; females <65 years), cigarette smoking, or LDL >=190 mg/dL).  If CAC is 1 to 99, it is reasonable to initiate statin therapy for patients >=62 years of age.  If CAC is >=100 or >=75th percentile, it is reasonable to initiate statin therapy at any age.  Cardiology referral should be considered for patients with CAC scores >=400 or >=75th percentile.  *2018 AHA/ACC/AACVPR/AAPA/ABC/ACPM/ADA/AGS/APhA/ASPC/NLA/PCNA Guideline on the Management of Blood Cholesterol: A Report of the American College of Cardiology/American Heart Association Task Force on Clinical Practice Guidelines. J Am Coll Cardiol. 2019;73(24):3168-3209.  Zoila Shutter, MD   Electronically Signed By: Chrystie Nose M.D. On: 04/06/2020 12:04  Narrative EXAM: OVER-READ INTERPRETATION  CT CHEST  The following report is an over-read performed by radiologist Dr. Trudie Reed of [facility] on 04/06/2020. This over-read does not include interpretation of cardiac or coronary anatomy or pathology. The coronary calcium score/coronary CTA interpretation by the cardiologist is attached.  COMPARISON:  None.  FINDINGS: Within the visualized portions of the thorax there are no suspicious appearing pulmonary nodules or masses, there is no acute consolidative airspace disease, no pleural effusions, no pneumothorax and no lymphadenopathy. Visualized portions of the upper abdomen are unremarkable. There are no aggressive appearing lytic or blastic lesions noted in the visualized portions of the skeleton.  IMPRESSION: 1. No significant incidental noncardiac findings are noted.  Electronically Signed: By: Trudie Reed M.D. On: 04/06/2020 10:01          Risk Assessment/Calculations:             Physical Exam:   VS:  BP 121/80   Pulse 60   Ht 6\' 2"  (1.88 m)   Wt 255 lb (115.7 kg)   BMI 32.74 kg/m    Wt Readings from Last 3  Encounters:  01/23/23 255 lb (115.7 kg)  07/29/22 260 lb 6.4 oz (118.1 kg)  04/12/20 276 lb (125.2 kg)    GEN: Well nourished, well developed in no acute distress NECK: No JVD; No carotid bruits CARDIAC: RRR, no murmurs, rubs, gallops RESPIRATORY:  Clear to auscultation without rales, wheezing or rhonchi  ABDOMEN: Soft, non-tender, non-distended EXTREMITIES:  No edema; No deformity   ASSESSMENT AND PLAN: .   Coronary artery disease - calcium score of of 971 placing him in the 90th percentile. Stable with no anginal symptoms. No indication for ischemic evaluation.  He is trying to augment his risk of future coronary events by losing weight.  Continue Wegovy, continue aspirin 81 mg daily, continue Crestor 20 mg daily, continue metoprolol 50 mg daily.  Will plan to check his LPA at next OV.  Will send in nitroglycerin as needed--discussed ED precautions.  Obesity -is BMI was 34 at his last OV >> down to 32.7, he continues to try to eat well however admits he could exercise more. Heart healthy diet and regular cardiovascular exercise encouraged.    AAA - noted on echo, AAA duplex was negative, CTA of the chest revealed largest diameter was 3.8 cm.  He has not a smoker but states his father also has an aneurysm.  HTN - BP is controlled today at 121/80, continue Zestoretic 20-12.5 mg daily, continue metoprolol 50 mg daily.  HLD -had fasting blood work yesterday, will prefer  his LDL to be less than 70.       Dispo: Nitroglycerin as needed, follow-up in 3 months.  Signed, Flossie Dibble, NP

## 2023-01-23 ENCOUNTER — Encounter: Payer: Self-pay | Admitting: Cardiology

## 2023-01-23 ENCOUNTER — Ambulatory Visit: Payer: BC Managed Care – PPO | Attending: Cardiology | Admitting: Cardiology

## 2023-01-23 VITALS — BP 121/80 | HR 60 | Ht 74.0 in | Wt 255.0 lb

## 2023-01-23 DIAGNOSIS — I2510 Atherosclerotic heart disease of native coronary artery without angina pectoris: Secondary | ICD-10-CM | POA: Diagnosis not present

## 2023-01-23 DIAGNOSIS — I7121 Aneurysm of the ascending aorta, without rupture: Secondary | ICD-10-CM | POA: Diagnosis not present

## 2023-01-23 DIAGNOSIS — I10 Essential (primary) hypertension: Secondary | ICD-10-CM

## 2023-01-23 DIAGNOSIS — E66811 Obesity, class 1: Secondary | ICD-10-CM

## 2023-01-23 DIAGNOSIS — E785 Hyperlipidemia, unspecified: Secondary | ICD-10-CM

## 2023-01-23 DIAGNOSIS — E782 Mixed hyperlipidemia: Secondary | ICD-10-CM | POA: Diagnosis not present

## 2023-01-23 LAB — LIPID PANEL
Chol/HDL Ratio: 3.3 ratio (ref 0.0–5.0)
Cholesterol, Total: 143 mg/dL (ref 100–199)
HDL: 44 mg/dL
LDL Chol Calc (NIH): 79 mg/dL (ref 0–99)
Triglycerides: 109 mg/dL (ref 0–149)
VLDL Cholesterol Cal: 20 mg/dL (ref 5–40)

## 2023-01-23 LAB — HEPATIC FUNCTION PANEL
ALT: 31 IU/L (ref 0–44)
AST: 28 IU/L (ref 0–40)
Albumin: 4.5 g/dL (ref 3.8–4.9)
Alkaline Phosphatase: 68 IU/L (ref 44–121)
Bilirubin Total: 0.4 mg/dL (ref 0.0–1.2)
Bilirubin, Direct: 0.17 mg/dL (ref 0.00–0.40)
Total Protein: 7.2 g/dL (ref 6.0–8.5)

## 2023-01-23 MED ORDER — NITROGLYCERIN 0.4 MG SL SUBL: 0.4000 mg | SUBLINGUAL_TABLET | SUBLINGUAL | 3 refills | Status: AC | PRN

## 2023-01-23 NOTE — Patient Instructions (Addendum)
Medication Instructions:  Your physician recommends that you continue on your current medications as directed. Please refer to the Current Medication list given to you today.  Nitroglycerin 0.4 mg sublingual (under your tongue) as needed for chest pain. If experiencing chest pain, stop what you are doing and sit down. Take 1 nitroglycerin and wait 5 minutes. If chest pain continues, take another nitroglycerin and wait 5 minutes. If chest pain does not subside, take 1 more nitroglycerin and dial 911. You make take a total of 3 nitroglycerin in a 15 minute time frame.   *If you need a refill on your cardiac medications before your next appointment, please call your pharmacy*   Lab Work: NONE If you have labs (blood work) drawn today and your tests are completely normal, you will receive your results only by: MyChart Message (if you have MyChart) OR A paper copy in the mail If you have any lab test that is abnormal or we need to change your treatment, we will call you to review the results.   Testing/Procedures: NONE   Follow-Up: At [facility], you and your health needs are our priority.  As part of our continuing mission to provide you with exceptional heart care, we have created designated Provider Care Teams.  These Care Teams include your primary Cardiologist (physician) and Advanced Practice Providers (APPs -  Physician Assistants and Nurse Practitioners) who all work together to provide you with the care you need, when you need it.  We recommend signing up for the patient portal called "MyChart".  Sign up information is provided on this After Visit Summary.  MyChart is used to connect with patients for Virtual Visits (Telemedicine).  Patients are able to view lab/test results, encounter notes, upcoming appointments, etc.  Non-urgent messages can be sent to your provider as well.   To learn more about what you can do with MyChart, go to [email or web address].    Your next  appointment:   3 month(s)  Provider:   Wallis Bamberg, NP Rosalita Levan)    Other Instructions

## 2023-01-30 DIAGNOSIS — I10 Essential (primary) hypertension: Secondary | ICD-10-CM | POA: Diagnosis not present

## 2023-01-30 DIAGNOSIS — Z125 Encounter for screening for malignant neoplasm of prostate: Secondary | ICD-10-CM | POA: Diagnosis not present

## 2023-01-30 DIAGNOSIS — Z23 Encounter for immunization: Secondary | ICD-10-CM | POA: Diagnosis not present

## 2023-01-30 DIAGNOSIS — Z0000 Encounter for general adult medical examination without abnormal findings: Secondary | ICD-10-CM | POA: Diagnosis not present

## 2023-02-11 ENCOUNTER — Other Ambulatory Visit (HOSPITAL_COMMUNITY): Payer: Self-pay

## 2023-02-11 ENCOUNTER — Telehealth: Payer: Self-pay | Admitting: Pharmacy Technician

## 2023-02-11 ENCOUNTER — Telehealth: Payer: Self-pay

## 2023-02-11 NOTE — Telephone Encounter (Signed)
Fax received stating patient needs a PA completed for Wegovy 24 mg.  I will route to the PA team.

## 2023-02-11 NOTE — Telephone Encounter (Signed)
 Pharmacy Patient Advocate Encounter   Received notification from Pt Calls Messages that prior authorization for Wegovy  is required/requested.   Insurance verification completed.   The patient is insured through  [employer]  .   Per test claim: PA required; PA submitted to above mentioned insurance via Fax Key/confirmation #/EOC faxed [employer] through [employer] form Status is pending    Phone for insurance: [phone] Fax for insurance: [phone].

## 2023-02-12 ENCOUNTER — Other Ambulatory Visit (HOSPITAL_COMMUNITY): Payer: Self-pay

## 2023-02-12 NOTE — Telephone Encounter (Signed)
patient is calling to see if there was an updates on prior auth on the medication wegovy

## 2023-02-13 ENCOUNTER — Other Ambulatory Visit (HOSPITAL_COMMUNITY): Payer: Self-pay

## 2023-02-17 NOTE — Telephone Encounter (Signed)
 Pharmacy Patient Advocate Encounter  Received notification from  [employer]  that Prior Authorization for Wegovy  has been APPROVED from 02/11/23 to 08/11/23  I called the pharmacy and had them process the claim. I think she said the copay was 80.00 for 30 days

## 2023-03-25 NOTE — Telephone Encounter (Signed)
 PA request has been Approved. New Encounter has been or will be created for follow up. For additional info see Pharmacy Prior Auth telephone encounter from 02/11/2023.

## 2023-04-10 DIAGNOSIS — G4733 Obstructive sleep apnea (adult) (pediatric): Secondary | ICD-10-CM | POA: Diagnosis not present

## 2023-04-11 DIAGNOSIS — E119 Type 2 diabetes mellitus without complications: Secondary | ICD-10-CM | POA: Insufficient documentation

## 2023-04-11 DIAGNOSIS — K219 Gastro-esophageal reflux disease without esophagitis: Secondary | ICD-10-CM | POA: Insufficient documentation

## 2023-04-11 DIAGNOSIS — E785 Hyperlipidemia, unspecified: Secondary | ICD-10-CM | POA: Insufficient documentation

## 2023-04-14 NOTE — Progress Notes (Signed)
 " Cardiology Office Note:  .   Date:  04/15/2023  ID:  Dan Miller, DOB 08-Jun-1965, MRN [id number] PCP: Dan Norleen PHEBE PONCE, MD  [facility] Providers Cardiologist:  Lamar Fitch, MD    History of Present Illness: .   Dan Miller is a 58 y.o. male with a past medical history of hypertension, CAD, ascending aortic aneurysm, OSA, dyslipidemia, morbid obesity.  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 04/06/2020 calcium  score of of 971 placing him in the 90th percentile  Most recently evaluated by Dr. Fitch on 04/12/2020, he was doing well at this time from a cardiac perspective, trying to make lifestyle changes.  At that time it was noted that his ascending aorta was dilated at 43 mm however his wife had just changed her job and there were some issues with insurance at that time.  Evaluated 07/29/2022  for follow-up of his CAD and aortic dilatation.  Was started on Wegovy , arrange for CT of the chest and AAA duplex for ascending aortic dilatation. Evaluated 01/23/2023 by myself for follow-up of his CAD, doing well, staying active at work, was working on weight loss and had previously started Wegovy , we made plans to follow back up in 3 months.  He presents today for follow-up of his CAD and for weight loss.  He has been doing well since he was last evaluated in our office in offers no formal complaints.  He has had success with Wegovy , his weight is down 16 pounds since he was last evaluated in our office, he is tolerating Wegovy  well without any adverse side effects. He is walking for exercise, eating less portions and drinking more water. He denies chest pain, palpitations, dyspnea, pnd, orthopnea, n, v, dizziness, syncope, edema, weight gain, or early satiety.   ROS: Review of Systems  All other systems reviewed and are negative.    Studies Reviewed: .        Cardiac Studies & Procedures    ______________________________________________________________________________________________     ECHOCARDIOGRAM  ECHOCARDIOGRAM COMPLETE 04/10/2020  Narrative ECHOCARDIOGRAM REPORT    Patient Name:   Dan Miller Date of Exam: 04/10/2020 Medical Rec #:  [id number]        Height:       75.0 in Accession #:    [id number]       Weight:       287.0 lb Date of Birth:  1965/08/12        BSA:          2.558 m Patient Age:    54 years         BP:           126/78 mmHg Patient Gender: M                HR:           55 bpm. Exam Location:  [place]  Procedure: 2D Echo  Indications:    Essential hypertension [I10]  History:        Patient has no prior history of Echocardiogram examinations. Risk Factors:Dyslipidemia.  Sonographer:    Lynwood Silvas Referring Phys: [phone] ROBERT J KRASOWSKI  IMPRESSIONS   1. Left ventricular ejection fraction, by estimation, is 50 to 55%. The left ventricle has low normal function. The left ventricle has no regional wall motion abnormalities. There is mild concentric left ventricular hypertrophy. Left ventricular diastolic parameters are consistent with Grade  II diastolic dysfunction (pseudonormalization). The average left ventricular global longitudinal strain is -10.0 %. The global longitudinal strain is abnormal. 2. Right ventricular systolic function is normal. The right ventricular size is normal. There is normal pulmonary artery systolic pressure. 3. The mitral valve is normal in structure. No evidence of mitral valve regurgitation. No evidence of mitral stenosis. 4. The aortic valve is normal in structure. Aortic valve regurgitation is not visualized. No aortic stenosis is present. 5. Aneurysm of the aortic root, measuring 43 mm. 6. The inferior vena cava is normal in size with greater than 50% respiratory variability, suggesting right atrial pressure of 3 mmHg.  FINDINGS Left Ventricle: Left ventricular ejection fraction, by estimation, is 50 to  55%. The left ventricle has low normal function. The left ventricle has no regional wall motion abnormalities. The average left ventricular global longitudinal strain is -10.0 %. The global longitudinal strain is abnormal. The left ventricular internal cavity size was normal in size. There is mild concentric left ventricular hypertrophy. Left ventricular diastolic parameters are consistent with Grade II diastolic dysfunction (pseudonormalization).  Right Ventricle: The right ventricular size is normal. No increase in right ventricular wall thickness. Right ventricular systolic function is normal. There is normal pulmonary artery systolic pressure. The tricuspid regurgitant velocity is 2.06 m/s, and with an assumed right atrial pressure of 3 mmHg, the estimated right ventricular systolic pressure is 20.0 mmHg.  Left Atrium: Left atrial size was normal in size.  Right Atrium: Right atrial size was normal in size.  Pericardium: There is no evidence of pericardial effusion. Presence of pericardial fat pad.  Mitral Valve: The mitral valve is normal in structure. No evidence of mitral valve regurgitation. No evidence of mitral valve stenosis.  Tricuspid Valve: The tricuspid valve is normal in structure. Tricuspid valve regurgitation is trivial. No evidence of tricuspid stenosis.  Aortic Valve: The aortic valve is normal in structure. Aortic valve regurgitation is not visualized. No aortic stenosis is present.  Pulmonic Valve: The pulmonic valve was normal in structure. Pulmonic valve regurgitation is not visualized. No evidence of pulmonic stenosis.  Aorta: Aortic dilatation noted. There is an aneurysm involving the aortic root measuring 43 mm.  Venous: The inferior vena cava is normal in size with greater than 50% respiratory variability, suggesting right atrial pressure of 3 mmHg.  IAS/Shunts: No atrial level shunt detected by color flow Doppler.   LEFT VENTRICLE PLAX 2D LVIDd:         5.60  cm      Diastology LVIDs:         3.80 cm      LV e' medial:    6.64 cm/s LV PW:         1.20 cm      LV E/e' medial:  12.4 LV IVS:        1.20 cm      LV e' lateral:   11.20 cm/s LVOT diam:     2.20 cm      LV E/e' lateral: 7.3 LV SV:         78 LV SV Index:   30           2D Longitudinal Strain LVOT Area:     3.80 cm     2D Strain GLS Avg:     -10.0 %  LV Volumes (MOD) LV vol d, MOD A4C: 101.0 ml LV vol s, MOD A4C: 45.8 ml LV SV MOD A4C:     101.0 ml  RIGHT  VENTRICLE             IVC RV S prime:     11.70 cm/s  IVC diam: 1.40 cm TAPSE (M-mode): 2.4 cm  LEFT ATRIUM           Index       RIGHT ATRIUM           Index LA diam:      4.30 cm 1.68 cm/m  RA Area:     14.70 cm LA Vol (A2C): 49.9 ml 19.50 ml/m RA Volume:   31.50 ml  12.31 ml/m LA Vol (A4C): 32.4 ml 12.66 ml/m AORTIC VALVE LVOT Vmax:   81.60 cm/s LVOT Vmean:  58.100 cm/s LVOT VTI:    0.204 m  AORTA Ao Root diam:  3.20 cm Ao Sinus diam: 4.30 cm Ao STJ diam:   3.5 cm Ao Asc diam:   3.85 cm Ao Desc diam:  2.70 cm  MITRAL VALVE               TRICUSPID VALVE MV Area (PHT): 3.65 cm    TR Peak grad:   17.0 mmHg MV Decel Time: 208 msec    TR Vmax:        206.00 cm/s MV E velocity: 82.30 cm/s MV A velocity: 56.10 cm/s  SHUNTS MV E/A ratio:  1.47        Systemic VTI:  0.20 m Systemic Diam: 2.20 cm  Kardie Tobb DO Electronically signed by Dub Huntsman DO Signature Date/Time: 04/10/2020/8:40:49 PM    Final      CT SCANS  CT CARDIAC SCORING (SELF PAY ONLY) 04/06/2020  Addendum 04/07/2020 10:32 AM ADDENDUM REPORT: 04/06/2020 17:40  CLINICAL DATA:  Risk stratification  EXAM: Coronary Calcium  Score  TECHNIQUE: The patient was scanned on a [employer] scanner. Axial non-contrast 3 mm slices were carried out through the heart. The data set was analyzed on a dedicated work station and scored using the Agatson method.  FINDINGS: Non-cardiac: See separate report from [facility].  Ascending  Aorta:  Pericardium: Normal  Coronary arteries:  IMPRESSION: Coronary calcium  score of 971. This was 43 percentile for age and sex matched control.  Lamar JINNY Fitch, MD   Electronically Signed By: Lamar Fitch On: 04/06/2020 17:40  Addendum 04/06/2020 12:06 PM ADDENDUM REPORT: 04/06/2020 12:04  CLINICAL DATA:  Cardiovascular Disease Risk stratification  EXAM: Coronary Calcium  Score  TECHNIQUE: A gated, non-contrast computed tomography scan of the heart was performed using 3mm slice thickness. Axial images were analyzed on a dedicated workstation. Calcium  scoring of the coronary arteries was performed using the Agatston method.  FINDINGS: Coronary arteries: Normal origins.  Coronary Calcium  Score:  Left main: 0  Left anterior descending artery: 123  Left circumflex artery: 35  Right coronary artery: 808  Total: 966  Percentile: 99th  Pericardium: Normal.  Ascending Aorta: Normal caliber.  Non-cardiac: See separate report from [facility]. Dilated main pulmonary artery, suggestive of pulmonary hypertension. Aortic atherosclerosis.  IMPRESSION: Coronary calcium  score of 966. This was 99th percentile for age-, race-, and sex-matched controls. Heavy calcification throughout a tortuous RCA and in the LAD.  Dilated main pulmonary artery suggestive of pulmonary hypertension.  Aortic atherosclerosis.  RECOMMENDATIONS: Coronary artery calcium  (CAC) score is a strong predictor of incident coronary heart disease (CHD) and provides predictive information beyond traditional risk factors. CAC scoring is reasonable to use in the decision to withhold, postpone, or initiate statin therapy in intermediate-risk or selected borderline-risk asymptomatic adults (age 56-75  years and LDL-C >=70 to <190 mg/dL) who do not have diabetes or established atherosclerotic cardiovascular disease (ASCVD).* In intermediate-risk (10-year ASCVD risk >=7.5% to <20%)  adults or selected borderline-risk (10-year ASCVD risk >=5% to <7.5%) adults in whom a CAC score is measured for the purpose of making a treatment decision the following recommendations have been made:  If CAC=0, it is reasonable to withhold statin therapy and reassess in 5 to 10 years, as long as higher risk conditions are absent (diabetes mellitus, family history of premature CHD in first degree relatives (males <55 years; females <65 years), cigarette smoking, or LDL >=190 mg/dL).  If CAC is 1 to 99, it is reasonable to initiate statin therapy for patients >=87 years of age.  If CAC is >=100 or >=75th percentile, it is reasonable to initiate statin therapy at any age.  Cardiology referral should be considered for patients with CAC scores >=400 or >=75th percentile.  *2018 AHA/ACC/AACVPR/AAPA/ABC/ACPM/ADA/AGS/APhA/ASPC/NLA/PCNA Guideline on the Management of Blood Cholesterol: A Report of the American College of Cardiology/American Heart Association Task Force on Clinical Practice Guidelines. J Am Coll Cardiol. 2019;73(24):3168-3209.  Vinie Maxcy, MD   Electronically Signed By: Vinie JAYSON Maxcy M.D. On: 04/06/2020 12:04  Narrative EXAM: OVER-READ INTERPRETATION  CT CHEST  The following report is an over-read performed by radiologist Dr. Toribio Aye of [facility] on 04/06/2020. This over-read does not include interpretation of cardiac or coronary anatomy or pathology. The coronary calcium  score/coronary CTA interpretation by the cardiologist is attached.  COMPARISON:  None.  FINDINGS: Within the visualized portions of the thorax there are no suspicious appearing pulmonary nodules or masses, there is no acute consolidative airspace disease, no pleural effusions, no pneumothorax and no lymphadenopathy. Visualized portions of the upper abdomen are unremarkable. There are no aggressive appearing lytic or blastic lesions noted in the visualized  portions of the skeleton.  IMPRESSION: 1. No significant incidental noncardiac findings are noted.  Electronically Signed: By: Toribio Aye M.D. On: 04/06/2020 10:01     ______________________________________________________________________________________________      Risk Assessment/Calculations:          Physical Exam:   VS:  BP (!) 130/90   Pulse 67   Ht 6' 2 (1.88 m)   SpO2 97%   BMI 32.74 kg/m    Wt Readings from Last 3 Encounters:  01/23/23 255 lb (115.7 kg)  07/29/22 260 lb 6.4 oz (118.1 kg)  04/12/20 276 lb (125.2 kg)    GEN: Well nourished, well developed in no acute distress NECK: No JVD; No carotid bruits CARDIAC: RRR, no murmurs, rubs, gallops RESPIRATORY:  Clear to auscultation without rales, wheezing or rhonchi  ABDOMEN: Soft, non-tender, non-distended EXTREMITIES:  No edema; No deformity   ASSESSMENT AND PLAN: .   Coronary artery disease - calcium  score of of 971 placing him in the 90th percentile. Stable with no anginal symptoms. No indication for ischemic evaluation.  He is trying to augment his risk of future coronary events by losing weight.  Continue Wegovy , continue aspirin 81 mg daily, continue Crestor  20 mg daily, continue metoprolol 50 mg daily.  Will plan to check his LPA at next OV.  Will send in nitroglycerin  as needed--discussed ED precautions.  Obesity -starting BMI 34>> down to 32.7 >> now 30.69. He is exercising by walking, watching his caloric intake and using Wegovy  to help prevent MACE, which is subsequently helping his obesity as well.   AAA - noted on echo, AAA duplex was negative, CTA of the  chest revealed largest diameter was 3.8 cm.  He has not a smoker but states his father also has an aneurysm.  HTN - BP is controlled today at 130/90, continue Zestoretic 20-12.5 mg daily, continue metoprolol 50 mg daily.  HLD -we adjusted his Crestor  at his last office visit, will repeat FLP and LFTs today.       Dispo: Continue Wegovy   2.4 mg injected into the skin once a week, CBC, FLP and CMET today,  follow-up in 3 months.  Signed, Delon JAYSON Hoover, NP  "

## 2023-04-15 ENCOUNTER — Encounter: Payer: Self-pay | Admitting: Cardiology

## 2023-04-15 ENCOUNTER — Ambulatory Visit: Payer: BC Managed Care – PPO | Attending: Cardiology | Admitting: Cardiology

## 2023-04-15 VITALS — BP 130/90 | HR 67 | Ht 74.0 in | Wt 239.0 lb

## 2023-04-15 DIAGNOSIS — E66811 Obesity, class 1: Secondary | ICD-10-CM

## 2023-04-15 DIAGNOSIS — I7121 Aneurysm of the ascending aorta, without rupture: Secondary | ICD-10-CM

## 2023-04-15 DIAGNOSIS — I10 Essential (primary) hypertension: Secondary | ICD-10-CM | POA: Diagnosis not present

## 2023-04-15 DIAGNOSIS — E782 Mixed hyperlipidemia: Secondary | ICD-10-CM | POA: Diagnosis not present

## 2023-04-15 DIAGNOSIS — I2510 Atherosclerotic heart disease of native coronary artery without angina pectoris: Secondary | ICD-10-CM

## 2023-04-15 MED ORDER — WEGOVY 2.4 MG/0.75ML ~~LOC~~ SOAJ: 2.4000 mg | SUBCUTANEOUS | 12 refills | Status: DC

## 2023-04-15 NOTE — Patient Instructions (Signed)
 Medication Instructions:  Your physician recommends that you continue on your current medications as directed. Please refer to the Current Medication list given to you today.  *If you need a refill on your cardiac medications before your next appointment, please call your pharmacy*  Lab Work: Your physician recommends that you return for lab work in:   Labs today: CMP, CBC, Lipids  If you have labs (blood work) drawn today and your tests are completely normal, you will receive your results only by: MyChart Message (if you have MyChart) OR A paper copy in the mail If you have any lab test that is abnormal or we need to change your treatment, we will call you to review the results.  Testing/Procedures: None  Follow-Up: At [facility], you and your health needs are our priority.  As part of our continuing mission to provide you with exceptional heart care, our providers are all part of one team.  This team includes your primary Cardiologist (physician) and Advanced Practice Providers or APPs (Physician Assistants and Nurse Practitioners) who all work together to provide you with the care you need, when you need it.  Your next appointment:   3 month(s)  Provider:   Wallis Bamberg, NP Rosalita Levan)    We recommend signing up for the patient portal called "MyChart".  Sign up information is provided on this After Visit Summary.  MyChart is used to connect with patients for Virtual Visits (Telemedicine).  Patients are able to view lab/test results, encounter notes, upcoming appointments, etc.  Non-urgent messages can be sent to your provider as well.   To learn more about what you can do with MyChart, go to [email or web address].   Other Instructions None

## 2023-04-16 LAB — CBC
Hematocrit: 41.6 % (ref 37.5–51.0)
Hemoglobin: 13.7 g/dL (ref 13.0–17.7)
MCH: 30.4 pg (ref 26.6–33.0)
MCHC: 32.9 g/dL (ref 31.5–35.7)
MCV: 92 fL (ref 79–97)
Platelets: 218 x10E3/uL (ref 150–450)
RBC: 4.5 x10E6/uL (ref 4.14–5.80)
RDW: 12.8 % (ref 11.6–15.4)
WBC: 6.2 x10E3/uL (ref 3.4–10.8)

## 2023-04-16 LAB — COMPREHENSIVE METABOLIC PANEL WITH GFR
ALT: 21 IU/L (ref 0–44)
AST: 20 IU/L (ref 0–40)
Albumin: 4.3 g/dL (ref 3.8–4.9)
Alkaline Phosphatase: 84 IU/L (ref 44–121)
BUN/Creatinine Ratio: 20 (ref 9–20)
BUN: 17 mg/dL (ref 6–24)
Bilirubin Total: 0.4 mg/dL (ref 0.0–1.2)
CO2: 24 mmol/L (ref 20–29)
Calcium: 10 mg/dL (ref 8.7–10.2)
Chloride: 100 mmol/L (ref 96–106)
Creatinine, Ser: 0.85 mg/dL (ref 0.76–1.27)
Globulin, Total: 2.4 g/dL (ref 1.5–4.5)
Glucose: 99 mg/dL (ref 70–99)
Potassium: 4.8 mmol/L (ref 3.5–5.2)
Sodium: 138 mmol/L (ref 134–144)
Total Protein: 6.7 g/dL (ref 6.0–8.5)
eGFR: 101 mL/min/1.73

## 2023-04-16 LAB — LIPID PANEL
Chol/HDL Ratio: 2.9 ratio (ref 0.0–5.0)
Cholesterol, Total: 128 mg/dL (ref 100–199)
HDL: 44 mg/dL
LDL Chol Calc (NIH): 60 mg/dL (ref 0–99)
Triglycerides: 137 mg/dL (ref 0–149)
VLDL Cholesterol Cal: 24 mg/dL (ref 5–40)

## 2023-05-10 DIAGNOSIS — G4733 Obstructive sleep apnea (adult) (pediatric): Secondary | ICD-10-CM | POA: Diagnosis not present

## 2023-06-10 DIAGNOSIS — G4733 Obstructive sleep apnea (adult) (pediatric): Secondary | ICD-10-CM | POA: Diagnosis not present

## 2023-07-18 NOTE — Progress Notes (Unsigned)
 Cardiology Office Note:  .   Date:  07/22/2023  ID:  Dan Miller, DOB February 07, 1965, MRN [id number] PCP: [name], MD  [facility] Providers Cardiologist:  Lamar Fitch, MD    History of Present Illness: .   Dan Miller is a 58 y.o. male with a past medical history of hypertension, CAD, ascending aortic aneurysm, OSA, dyslipidemia, morbid obesity.  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 04/06/2020 calcium  score of of 971 placing him in the 90th percentile  Most recently evaluated by Dr. Fitch on 04/12/2020, he was doing well at this time from a cardiac perspective, trying to make lifestyle changes.  At that time it was noted that his ascending aorta was dilated at 43 mm however his wife had just changed her job and there were some issues with insurance at that time. Evaluated 07/29/2022  for follow-up of his CAD and aortic dilatation.  Was started on Wegovy , arrange for CT of the chest and AAA duplex for ascending aortic dilatation. Evaluated 01/23/2023 by myself for follow-up of his CAD, doing well, staying active at work, was working on weight loss and had previously started Wegovy , we made plans to follow back up in 3 months.  Most recently was evaluated by myself on 04/15/2023 for follow-up of his CAD and obesity, he was doing well with Wegovy  and had lost 16 pounds, tolerating without any adverse side effects.  We continued his Wegovy  and made plans for him to follow-up in 3 months.  He presents today for follow-up of his coronary artery disease and hypertension.  He has been doing stable since he was last evaluated in our office, offers no formal complaints.  He does continue to lose weight, down 8 pounds since he was last evaluated in our office, total weight loss of 29 pounds since he started on Wegovy .  His  blood pressure is well-controlled, his cholesterol appears to be well-controlled. He denies chest pain, palpitations, dyspnea, pnd, orthopnea, n, v, dizziness, syncope, edema, weight gain, or early satiety.    ROS: Review of Systems  All other systems reviewed and are negative.    Studies Reviewed: SABRA   EKG Interpretation Date/Time:  Tuesday July 22 2023 08:03:07 EDT Ventricular Rate:  65 PR Interval:  170 QRS Duration:  82 QT Interval:  412 QTC Calculation: 428 R Axis:   65  Text Interpretation: Normal sinus rhythm Normal ECG When compared with ECG of 29-Jul-2022 11:25, Nonspecific T wave abnormality, improved in Inferior leads Confirmed by Carlin Nest [phone]) on 07/22/2023 8:04:00 AM    Cardiac Studies & Procedures   ______________________________________________________________________________________________     ECHOCARDIOGRAM  ECHOCARDIOGRAM COMPLETE 04/10/2020  Narrative ECHOCARDIOGRAM REPORT    Patient Name:   Dan Miller Date of Exam: 04/10/2020 Medical Rec #:  [id number]        Height:       75.0 in Accession #:    [id number]       Weight:       287.0 lb Date of Birth:  July 20, 1965        BSA:          2.558 m Patient Age:    54 years         BP:           126/78 mmHg Patient Gender: M  HR:           55 bpm. Exam Location:  [facility]  Procedure: 2D Echo  Indications:    Essential hypertension [I10]  History:        Patient has no prior history of Echocardiogram examinations. Risk Factors:Dyslipidemia.  Sonographer:    Lynwood Silvas Referring Phys: [phone] ROBERT J KRASOWSKI  IMPRESSIONS   1. Left ventricular ejection fraction, by estimation, is 50 to 55%. The left ventricle has low normal function. The left ventricle has no regional wall motion abnormalities. There is mild concentric left ventricular hypertrophy. Left ventricular diastolic parameters are consistent with Grade II diastolic dysfunction (pseudonormalization). The average left  ventricular global longitudinal strain is -10.0 %. The global longitudinal strain is abnormal. 2. Right ventricular systolic function is normal. The right ventricular size is normal. There is normal pulmonary artery systolic pressure. 3. The mitral valve is normal in structure. No evidence of mitral valve regurgitation. No evidence of mitral stenosis. 4. The aortic valve is normal in structure. Aortic valve regurgitation is not visualized. No aortic stenosis is present. 5. Aneurysm of the aortic root, measuring 43 mm. 6. The inferior vena cava is normal in size with greater than 50% respiratory variability, suggesting right atrial pressure of 3 mmHg.  FINDINGS Left Ventricle: Left ventricular ejection fraction, by estimation, is 50 to 55%. The left ventricle has low normal function. The left ventricle has no regional wall motion abnormalities. The average left ventricular global longitudinal strain is -10.0 %. The global longitudinal strain is abnormal. The left ventricular internal cavity size was normal in size. There is mild concentric left ventricular hypertrophy. Left ventricular diastolic parameters are consistent with Grade II diastolic dysfunction (pseudonormalization).  Right Ventricle: The right ventricular size is normal. No increase in right ventricular wall thickness. Right ventricular systolic function is normal. There is normal pulmonary artery systolic pressure. The tricuspid regurgitant velocity is 2.06 m/s, and with an assumed right atrial pressure of 3 mmHg, the estimated right ventricular systolic pressure is 20.0 mmHg.  Left Atrium: Left atrial size was normal in size.  Right Atrium: Right atrial size was normal in size.  Pericardium: There is no evidence of pericardial effusion. Presence of pericardial fat pad.  Mitral Valve: The mitral valve is normal in structure. No evidence of mitral valve regurgitation. No evidence of mitral valve stenosis.  Tricuspid Valve: The  tricuspid valve is normal in structure. Tricuspid valve regurgitation is trivial. No evidence of tricuspid stenosis.  Aortic Valve: The aortic valve is normal in structure. Aortic valve regurgitation is not visualized. No aortic stenosis is present.  Pulmonic Valve: The pulmonic valve was normal in structure. Pulmonic valve regurgitation is not visualized. No evidence of pulmonic stenosis.  Aorta: Aortic dilatation noted. There is an aneurysm involving the aortic root measuring 43 mm.  Venous: The inferior vena cava is normal in size with greater than 50% respiratory variability, suggesting right atrial pressure of 3 mmHg.  IAS/Shunts: No atrial level shunt detected by color flow Doppler.   LEFT VENTRICLE PLAX 2D LVIDd:         5.60 cm      Diastology LVIDs:         3.80 cm      LV e' medial:    6.64 cm/s LV PW:         1.20 cm      LV E/e' medial:  12.4 LV IVS:        1.20 cm  LV e' lateral:   11.20 cm/s LVOT diam:     2.20 cm      LV E/e' lateral: 7.3 LV SV:         78 LV SV Index:   30           2D Longitudinal Strain LVOT Area:     3.80 cm     2D Strain GLS Avg:     -10.0 %  LV Volumes (MOD) LV vol d, MOD A4C: 101.0 ml LV vol s, MOD A4C: 45.8 ml LV SV MOD A4C:     101.0 ml  RIGHT VENTRICLE             IVC RV S prime:     11.70 cm/s  IVC diam: 1.40 cm TAPSE (M-mode): 2.4 cm  LEFT ATRIUM           Index       RIGHT ATRIUM           Index LA diam:      4.30 cm 1.68 cm/m  RA Area:     14.70 cm LA Vol (A2C): 49.9 ml 19.50 ml/m RA Volume:   31.50 ml  12.31 ml/m LA Vol (A4C): 32.4 ml 12.66 ml/m AORTIC VALVE LVOT Vmax:   81.60 cm/s LVOT Vmean:  58.100 cm/s LVOT VTI:    0.204 m  AORTA Ao Root diam:  3.20 cm Ao Sinus diam: 4.30 cm Ao STJ diam:   3.5 cm Ao Asc diam:   3.85 cm Ao Desc diam:  2.70 cm  MITRAL VALVE               TRICUSPID VALVE MV Area (PHT): 3.65 cm    TR Peak grad:   17.0 mmHg MV Decel Time: 208 msec    TR Vmax:        206.00 cm/s MV E velocity:  82.30 cm/s MV A velocity: 56.10 cm/s  SHUNTS MV E/A ratio:  1.47        Systemic VTI:  0.20 m Systemic Diam: 2.20 cm  Kardie Tobb DO Electronically signed by Dub Huntsman DO Signature Date/Time: 04/10/2020/8:40:49 PM    Final      CT SCANS  CT CARDIAC SCORING (SELF PAY ONLY) 04/06/2020  Addendum 04/07/2020 10:32 AM ADDENDUM REPORT: 04/06/2020 17:40  CLINICAL DATA:  Risk stratification  EXAM: Coronary Calcium  Score  TECHNIQUE: The patient was scanned on a [employer] scanner. Axial non-contrast 3 mm slices were carried out through the heart. The data set was analyzed on a dedicated work station and scored using the Agatson method.  FINDINGS: Non-cardiac: See separate report from [facility].  Ascending Aorta:  Pericardium: Normal  Coronary arteries:  IMPRESSION: Coronary calcium  score of 971. This was 69 percentile for age and sex matched control.  Lamar JINNY Fitch, MD   Electronically Signed By: Lamar Fitch On: 04/06/2020 17:40  Addendum 04/06/2020 12:06 PM ADDENDUM REPORT: 04/06/2020 12:04  CLINICAL DATA:  Cardiovascular Disease Risk stratification  EXAM: Coronary Calcium  Score  TECHNIQUE: A gated, non-contrast computed tomography scan of the heart was performed using 3mm slice thickness. Axial images were analyzed on a dedicated workstation. Calcium  scoring of the coronary arteries was performed using the Agatston method.  FINDINGS: Coronary arteries: Normal origins.  Coronary Calcium  Score:  Left main: 0  Left anterior descending artery: 123  Left circumflex artery: 35  Right coronary artery: 808  Total: 966  Percentile: 99th  Pericardium: Normal.  Ascending Aorta: Normal caliber.  Non-cardiac: See separate report from [facility]. Dilated main pulmonary artery, suggestive of pulmonary hypertension. Aortic atherosclerosis.  IMPRESSION: Coronary calcium  score of 966. This was 99th percentile for  age-, race-, and sex-matched controls. Heavy calcification throughout a tortuous RCA and in the LAD.  Dilated main pulmonary artery suggestive of pulmonary hypertension.  Aortic atherosclerosis.  RECOMMENDATIONS: Coronary artery calcium  (CAC) score is a strong predictor of incident coronary heart disease (CHD) and provides predictive information beyond traditional risk factors. CAC scoring is reasonable to use in the decision to withhold, postpone, or initiate statin therapy in intermediate-risk or selected borderline-risk asymptomatic adults (age 87-75 years and LDL-C >=70 to <190 mg/dL) who do not have diabetes or established atherosclerotic cardiovascular disease (ASCVD).* In intermediate-risk (10-year ASCVD risk >=7.5% to <20%) adults or selected borderline-risk (10-year ASCVD risk >=5% to <7.5%) adults in whom a CAC score is measured for the purpose of making a treatment decision the following recommendations have been made:  If CAC=0, it is reasonable to withhold statin therapy and reassess in 5 to 10 years, as long as higher risk conditions are absent (diabetes mellitus, family history of premature CHD in first degree relatives (males <55 years; females <65 years), cigarette smoking, or LDL >=190 mg/dL).  If CAC is 1 to 99, it is reasonable to initiate statin therapy for patients >=49 years of age.  If CAC is >=100 or >=75th percentile, it is reasonable to initiate statin therapy at any age.  Cardiology referral should be considered for patients with CAC scores >=400 or >=75th percentile.  *2018 AHA/ACC/AACVPR/AAPA/ABC/ACPM/ADA/AGS/APhA/ASPC/NLA/PCNA Guideline on the Management of Blood Cholesterol: A Report of the American College of Cardiology/American Heart Association Task Force on Clinical Practice Guidelines. J Am Coll Cardiol. 2019;73(24):3168-3209.  Vinie Maxcy, MD   Electronically Signed By: Vinie JAYSON Maxcy M.D. On: 04/06/2020  12:04  Narrative EXAM: OVER-READ INTERPRETATION  CT CHEST  The following report is an over-read performed by radiologist Dr. Toribio Aye of [facility] on 04/06/2020. This over-read does not include interpretation of cardiac or coronary anatomy or pathology. The coronary calcium  score/coronary CTA interpretation by the cardiologist is attached.  COMPARISON:  None.  FINDINGS: Within the visualized portions of the thorax there are no suspicious appearing pulmonary nodules or masses, there is no acute consolidative airspace disease, no pleural effusions, no pneumothorax and no lymphadenopathy. Visualized portions of the upper abdomen are unremarkable. There are no aggressive appearing lytic or blastic lesions noted in the visualized portions of the skeleton.  IMPRESSION: 1. No significant incidental noncardiac findings are noted.  Electronically Signed: By: Toribio Aye M.D. On: 04/06/2020 10:01     ______________________________________________________________________________________________      Risk Assessment/Calculations:          Physical Exam:   VS:  BP 120/80   Pulse 65   Ht 6' 2 (1.88 m)   Wt 231 lb (104.8 kg)   SpO2 98%   BMI 29.66 kg/m    Wt Readings from Last 3 Encounters:  07/22/23 231 lb (104.8 kg)  04/15/23 239 lb (108.4 kg)  01/23/23 255 lb (115.7 kg)    GEN: Well nourished, well developed in no acute distress NECK: No JVD; No carotid bruits CARDIAC: RRR, no murmurs, rubs, gallops RESPIRATORY:  Clear to auscultation without rales, wheezing or rhonchi  ABDOMEN: Soft, non-tender, non-distended EXTREMITIES:  No edema; No deformity   ASSESSMENT AND PLAN: .   Coronary artery disease - calcium  score of of 971 placing him in the 90th percentile. Stable  with no anginal symptoms. No indication for ischemic evaluation.  He is trying to augment his risk of future coronary events by losing weight.  Continue Wegovy , continue aspirin 81  mg daily, continue Crestor  20 mg daily, continue metoprolol 50 mg daily.  Will send in nitroglycerin  as needed--discussed ED precautions.  Obesity -starting BMI 34>> down to 32.7 >> 30.69 >> 29.66 . He is exercising by walking, watching his caloric intake and using Wegovy  to help prevent MACE, which is subsequently helping his obesity as well.   AAA - noted on echo, AAA duplex was negative, CTA of the chest revealed largest diameter was 3.8 cm.  He has not a smoker but states his father also has an aneurysm.  HTN - BP is well-controlled today at 120/80, continue Zestoretic 20-12.5 mg daily, continue metoprolol 50 mg daily.  HLD -most recent LDL was well controlled at 60 04/15/2023; would like to check his LP(a) and Apo B, he has upcoming appt with his PCP for wellness exam and they plan to collect labs, will see if they will check these as well, if not, we will recheck at next OV.        Dispo: Continue Wegovy  2.4 mg injected into the skin once a week,  follow-up in 3 months.  Signed, Delon JAYSON Hoover, NP

## 2023-07-22 ENCOUNTER — Ambulatory Visit: Attending: Cardiology | Admitting: Cardiology

## 2023-07-22 ENCOUNTER — Encounter: Payer: Self-pay | Admitting: Cardiology

## 2023-07-22 VITALS — BP 120/80 | HR 65 | Ht 74.0 in | Wt 231.0 lb

## 2023-07-22 DIAGNOSIS — E66811 Obesity, class 1: Secondary | ICD-10-CM | POA: Diagnosis not present

## 2023-07-22 DIAGNOSIS — I2510 Atherosclerotic heart disease of native coronary artery without angina pectoris: Secondary | ICD-10-CM | POA: Diagnosis not present

## 2023-07-22 DIAGNOSIS — E782 Mixed hyperlipidemia: Secondary | ICD-10-CM | POA: Diagnosis not present

## 2023-07-22 DIAGNOSIS — I10 Essential (primary) hypertension: Secondary | ICD-10-CM

## 2023-07-22 NOTE — Patient Instructions (Signed)
 Medication Instructions:   No changes   *If you need a refill on your cardiac medications before your next appointment, please call your pharmacy*  Lab Work:    If you have labs (blood work) drawn today and your tests are completely normal, you will receive your results only by: MyChart Message (if you have MyChart) OR A paper copy in the mail If you have any lab test that is abnormal or we need to change your treatment, we will call you to review the results.  Testing/Procedures:  None today, but would like your PCP to check LP(a) and Apo B with your annual labs.  Follow-Up: At [facility], you and your health needs are our priority.  As part of our continuing mission to provide you with exceptional heart care, our providers are all part of one team.  This team includes your primary Cardiologist (physician) and Advanced Practice Providers or APPs (Physician Assistants and Nurse Practitioners) who all work together to provide you with the care you need, when you need it.  Your next appointment:   3 month(s)  Provider:   Delon Hoover, NP [place])    We recommend signing up for the patient portal called MyChart.  Sign up information is provided on this After Visit Summary.  MyChart is used to connect with patients for Virtual Visits (Telemedicine).  Patients are able to view lab/test results, encounter notes, upcoming appointments, etc.  Non-urgent messages can be sent to your provider as well.   To learn more about what you can do with MyChart, go to [email or web address].   Other Instructions   Start exercising at least 30 minutes of purposeful exercise per week, then increase.

## 2023-07-29 DIAGNOSIS — K219 Gastro-esophageal reflux disease without esophagitis: Secondary | ICD-10-CM | POA: Diagnosis not present

## 2023-07-29 DIAGNOSIS — I10 Essential (primary) hypertension: Secondary | ICD-10-CM | POA: Diagnosis not present

## 2023-07-29 DIAGNOSIS — E782 Mixed hyperlipidemia: Secondary | ICD-10-CM | POA: Diagnosis not present

## 2023-07-29 DIAGNOSIS — F411 Generalized anxiety disorder: Secondary | ICD-10-CM | POA: Diagnosis not present

## 2023-08-26 ENCOUNTER — Encounter: Payer: Self-pay | Admitting: Cardiology

## 2023-08-26 ENCOUNTER — Telehealth: Payer: Self-pay | Admitting: Cardiology

## 2023-10-06 DIAGNOSIS — H35033 Hypertensive retinopathy, bilateral: Secondary | ICD-10-CM | POA: Diagnosis not present

## 2023-10-06 DIAGNOSIS — H524 Presbyopia: Secondary | ICD-10-CM | POA: Diagnosis not present

## 2023-10-15 DIAGNOSIS — I10 Essential (primary) hypertension: Secondary | ICD-10-CM | POA: Insufficient documentation

## 2023-10-15 NOTE — Progress Notes (Deleted)
 Cardiology Office Note:  .   Date:  10/15/2023  ID:  Dan Miller, DOB 1965/01/10, MRN [id number] PCP: [name], MD   [facility] Providers Cardiologist:  Lamar Fitch, MD    History of Present Illness: .   Dan Miller is a 58 y.o. male with a past medical history of hypertension, CAD, ascending aortic aneurysm, OSA, dyslipidemia, morbid obesity.  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 04/06/2020 calcium  score of of 971 placing him in the 90th percentile  He establish care with Dr. Fitch in 2022 for the evaluation and management of his hypertension.  He underwent a calcium  score in March or 2022 which was not 71 placing him in the 90th percentile, echocardiogram at that time revealed a preserved EF with grade 2 DD and aneurysm of the aortic root 43 mm, however his wife had just changed her job and there were some issues with insurance at that time but no further evaluation was made.   Evaluated 07/29/2022  for follow-up of his CAD and aortic dilatation.  Was started on Wegovy , arrange for CT of the chest and AAA duplex for ascending aortic dilatation. Evaluated 01/23/2023 by myself for follow-up of his CAD, doing well, staying active at work, was working on weight loss and had previously started Wegovy , we made plans to follow back up in 3 months.  We have followed up routinely every 3 months checking on his progression with weight loss and cardiovascular symptoms.  Most recently evaluated by myself 07/22/2023, his weight was 231, blood pressure was well-controlled as was his cholesterol no changes to medications or plan of care and advised to follow-up in 3 months.  07/29/2022 weight 260 01/23/2023 weight 255 04/15/2023 weight 239 07/22/2023 weight 231     ROS: Review of Systems  All other systems reviewed  and are negative.    Studies Reviewed: .        Cardiac Studies & Procedures   ______________________________________________________________________________________________     ECHOCARDIOGRAM  ECHOCARDIOGRAM COMPLETE 04/10/2020  Narrative ECHOCARDIOGRAM REPORT    Patient Name:   Dan Miller Date of Exam: 04/10/2020 Medical Rec #:  [id number]        Height:       75.0 in Accession #:    [id number]       Weight:       287.0 lb Date of Birth:  07-May-1965        BSA:          2.558 m Patient Age:    54 years         BP:           126/78 mmHg Patient Gender: M                HR:           55 bpm. Exam Location:  [place]  Procedure: 2D Echo  Indications:    Essential hypertension [I10]  History:        Patient has no prior history of Echocardiogram examinations. Risk Factors:Dyslipidemia.  Sonographer:    Lynwood Silvas Referring Phys: [phone] Dan Miller  IMPRESSIONS   1. Left ventricular ejection fraction, by estimation, is 50 to 55%. The left ventricle has low normal function. The left ventricle has no regional wall motion abnormalities. There is mild concentric  left ventricular hypertrophy. Left ventricular diastolic parameters are consistent with Grade II diastolic dysfunction (pseudonormalization). The average left ventricular global longitudinal strain is -10.0 %. The global longitudinal strain is abnormal. 2. Right ventricular systolic function is normal. The right ventricular size is normal. There is normal pulmonary artery systolic pressure. 3. The mitral valve is normal in structure. No evidence of mitral valve regurgitation. No evidence of mitral stenosis. 4. The aortic valve is normal in structure. Aortic valve regurgitation is not visualized. No aortic stenosis is present. 5. Aneurysm of the aortic root, measuring 43 mm. 6. The inferior vena cava is normal in size with greater than 50% respiratory variability, suggesting right atrial pressure of 3  mmHg.  FINDINGS Left Ventricle: Left ventricular ejection fraction, by estimation, is 50 to 55%. The left ventricle has low normal function. The left ventricle has no regional wall motion abnormalities. The average left ventricular global longitudinal strain is -10.0 %. The global longitudinal strain is abnormal. The left ventricular internal cavity size was normal in size. There is mild concentric left ventricular hypertrophy. Left ventricular diastolic parameters are consistent with Grade II diastolic dysfunction (pseudonormalization).  Right Ventricle: The right ventricular size is normal. No increase in right ventricular wall thickness. Right ventricular systolic function is normal. There is normal pulmonary artery systolic pressure. The tricuspid regurgitant velocity is 2.06 m/s, and with an assumed right atrial pressure of 3 mmHg, the estimated right ventricular systolic pressure is 20.0 mmHg.  Left Atrium: Left atrial size was normal in size.  Right Atrium: Right atrial size was normal in size.  Pericardium: There is no evidence of pericardial effusion. Presence of pericardial fat pad.  Mitral Valve: The mitral valve is normal in structure. No evidence of mitral valve regurgitation. No evidence of mitral valve stenosis.  Tricuspid Valve: The tricuspid valve is normal in structure. Tricuspid valve regurgitation is trivial. No evidence of tricuspid stenosis.  Aortic Valve: The aortic valve is normal in structure. Aortic valve regurgitation is not visualized. No aortic stenosis is present.  Pulmonic Valve: The pulmonic valve was normal in structure. Pulmonic valve regurgitation is not visualized. No evidence of pulmonic stenosis.  Aorta: Aortic dilatation noted. There is an aneurysm involving the aortic root measuring 43 mm.  Venous: The inferior vena cava is normal in size with greater than 50% respiratory variability, suggesting right atrial pressure of 3 mmHg.  IAS/Shunts: No  atrial level shunt detected by color flow Doppler.   LEFT VENTRICLE PLAX 2D LVIDd:         5.60 cm      Diastology LVIDs:         3.80 cm      LV e' medial:    6.64 cm/s LV PW:         1.20 cm      LV E/e' medial:  12.4 LV IVS:        1.20 cm      LV e' lateral:   11.20 cm/s LVOT diam:     2.20 cm      LV E/e' lateral: 7.3 LV SV:         78 LV SV Index:   30           2D Longitudinal Strain LVOT Area:     3.80 cm     2D Strain GLS Avg:     -10.0 %  LV Volumes (MOD) LV vol d, MOD A4C: 101.0 ml LV vol s, MOD A4C: 45.8 ml LV  SV MOD A4C:     101.0 ml  RIGHT VENTRICLE             IVC RV S prime:     11.70 cm/s  IVC diam: 1.40 cm TAPSE (M-mode): 2.4 cm  LEFT ATRIUM           Index       RIGHT ATRIUM           Index LA diam:      4.30 cm 1.68 cm/m  RA Area:     14.70 cm LA Vol (A2C): 49.9 ml 19.50 ml/m RA Volume:   31.50 ml  12.31 ml/m LA Vol (A4C): 32.4 ml 12.66 ml/m AORTIC VALVE LVOT Vmax:   81.60 cm/s LVOT Vmean:  58.100 cm/s LVOT VTI:    0.204 m  AORTA Ao Root diam:  3.20 cm Ao Sinus diam: 4.30 cm Ao STJ diam:   3.5 cm Ao Asc diam:   3.85 cm Ao Desc diam:  2.70 cm  MITRAL VALVE               TRICUSPID VALVE MV Area (PHT): 3.65 cm    TR Peak grad:   17.0 mmHg MV Decel Time: 208 msec    TR Vmax:        206.00 cm/s MV E velocity: 82.30 cm/s MV A velocity: 56.10 cm/s  SHUNTS MV E/A ratio:  1.47        Systemic VTI:  0.20 m Systemic Diam: 2.20 cm  Kardie Tobb DO Electronically signed by Dan Huntsman DO Signature Date/Time: 04/10/2020/8:40:49 PM    Final      CT SCANS  CT CARDIAC SCORING (SELF PAY ONLY) 04/06/2020  Addendum 04/07/2020 10:32 AM ADDENDUM REPORT: 04/06/2020 17:40  CLINICAL DATA:  Risk stratification  EXAM: Coronary Calcium  Score  TECHNIQUE: The patient was scanned on a [employer] scanner. Axial non-contrast 3 mm slices were carried out through the heart. The data set was analyzed on a dedicated work station and scored using the Agatson  method.  FINDINGS: Non-cardiac: See separate report from [facility].  Ascending Aorta:  Pericardium: Normal  Coronary arteries:  IMPRESSION: Coronary calcium  score of 971. This was 65 percentile for age and sex matched control.  Lamar JINNY Fitch, MD   Electronically Signed By: Lamar Fitch On: 04/06/2020 17:40  Addendum 04/06/2020 12:06 PM ADDENDUM REPORT: 04/06/2020 12:04  CLINICAL DATA:  Cardiovascular Disease Risk stratification  EXAM: Coronary Calcium  Score  TECHNIQUE: A gated, non-contrast computed tomography scan of the heart was performed using 3mm slice thickness. Axial images were analyzed on a dedicated workstation. Calcium  scoring of the coronary arteries was performed using the Agatston method.  FINDINGS: Coronary arteries: Normal origins.  Coronary Calcium  Score:  Left main: 0  Left anterior descending artery: 123  Left circumflex artery: 35  Right coronary artery: 808  Total: 966  Percentile: 99th  Pericardium: Normal.  Ascending Aorta: Normal caliber.  Non-cardiac: See separate report from [facility]. Dilated main pulmonary artery, suggestive of pulmonary hypertension. Aortic atherosclerosis.  IMPRESSION: Coronary calcium  score of 966. This was 99th percentile for age-, race-, and sex-matched controls. Heavy calcification throughout a tortuous RCA and in the LAD.  Dilated main pulmonary artery suggestive of pulmonary hypertension.  Aortic atherosclerosis.  RECOMMENDATIONS: Coronary artery calcium  (CAC) score is a strong predictor of incident coronary heart disease (CHD) and provides predictive information beyond traditional risk factors. CAC scoring is reasonable to use in the decision to withhold, postpone, or initiate  statin therapy in intermediate-risk or selected borderline-risk asymptomatic adults (age 58-75 years and LDL-C >=70 to <190 mg/dL) who do not have diabetes or established  atherosclerotic cardiovascular disease (ASCVD).* In intermediate-risk (10-year ASCVD risk >=7.5% to <20%) adults or selected borderline-risk (10-year ASCVD risk >=5% to <7.5%) adults in whom a CAC score is measured for the purpose of making a treatment decision the following recommendations have been made:  If CAC=0, it is reasonable to withhold statin therapy and reassess in 5 to 10 years, as long as higher risk conditions are absent (diabetes mellitus, family history of premature CHD in first degree relatives (males <55 years; females <65 years), cigarette smoking, or LDL >=190 mg/dL).  If CAC is 1 to 99, it is reasonable to initiate statin therapy for patients >=38 years of age.  If CAC is >=100 or >=75th percentile, it is reasonable to initiate statin therapy at any age.  Cardiology referral should be considered for patients with CAC scores >=400 or >=75th percentile.  *2018 AHA/ACC/AACVPR/AAPA/ABC/ACPM/ADA/AGS/APhA/ASPC/NLA/PCNA Guideline on the Management of Blood Cholesterol: A Report of the American College of Cardiology/American Heart Association Task Force on Clinical Practice Guidelines. J Am Coll Cardiol. 2019;73(24):3168-3209.  Vinie Maxcy, MD   Electronically Signed By: Vinie Dan Miller M.D. On: 04/06/2020 12:04  Narrative EXAM: OVER-READ INTERPRETATION  CT CHEST  The following report is an over-read performed by radiologist Dr. Toribio Miller of [facility] on 04/06/2020. This over-read does not include interpretation of cardiac or coronary anatomy or pathology. The coronary calcium  score/coronary CTA interpretation by the cardiologist is attached.  COMPARISON:  None.  FINDINGS: Within the visualized portions of the thorax there are no suspicious appearing pulmonary nodules or masses, there is no acute consolidative airspace disease, no pleural effusions, no pneumothorax and no lymphadenopathy. Visualized portions of the upper abdomen  are unremarkable. There are no aggressive appearing lytic or blastic lesions noted in the visualized portions of the skeleton.  IMPRESSION: 1. No significant incidental noncardiac findings are noted.  Electronically Signed: By: Dan Miller M.D. On: 04/06/2020 10:01     ______________________________________________________________________________________________      Risk Assessment/Calculations:          Physical Exam:   VS:  There were no vitals taken for this visit.   Wt Readings from Last 3 Encounters:  07/22/23 231 lb (104.8 kg)  04/15/23 239 lb (108.4 kg)  01/23/23 255 lb (115.7 kg)    GEN: Well nourished, well developed in no acute distress NECK: No JVD; No carotid bruits CARDIAC: RRR, no murmurs, rubs, gallops RESPIRATORY:  Clear to auscultation without rales, wheezing or rhonchi  ABDOMEN: Soft, non-tender, non-distended EXTREMITIES:  No edema; No deformity   ASSESSMENT AND PLAN: .   Coronary artery disease - calcium  score of of 971 placing him in the 90th percentile. Stable with no anginal symptoms. No indication for ischemic evaluation.  He is trying to augment his risk of future coronary events by losing weight.  Continue Wegovy , continue aspirin 81 mg daily, continue Crestor  20 mg daily, continue metoprolol 50 mg daily.  Will send in nitroglycerin  as needed--discussed ED precautions.  Obesity -starting BMI 34>> down to 32.7 >> 30.69 >> 29.66 . He is exercising by walking, watching his caloric intake and using Wegovy  to help prevent MACE, which is subsequently helping his obesity as well.   AAA - noted on echo, AAA duplex was negative, CTA of the chest revealed largest diameter was 3.8 cm.  He has not a smoker but states  his father also has an aneurysm.  HTN - BP is well-controlled today at 120/80, continue Zestoretic 20-12.5 mg daily, continue metoprolol 50 mg daily.  HLD -most recent LDL was well controlled at 60 04/15/2023; would like to check his LP(a) and  Apo B, he has upcoming appt with his PCP for wellness exam and they plan to collect labs, will see if they will check these as well, if not, we will recheck at next OV.        Dispo: Continue Wegovy  2.4 mg injected into the skin once a week,  follow-up in 3 months.  Signed, Dan Dan Hoover, NP

## 2023-10-16 ENCOUNTER — Other Ambulatory Visit: Payer: Self-pay | Admitting: Cardiology

## 2023-10-16 ENCOUNTER — Telehealth: Payer: Self-pay

## 2023-10-16 NOTE — Telephone Encounter (Signed)
 Fax received from [facility] requesting a prior authorization for patient's Wegovy , I will forward to the PA team to complete.

## 2023-10-17 ENCOUNTER — Other Ambulatory Visit (HOSPITAL_COMMUNITY): Payer: Self-pay

## 2023-10-17 NOTE — Telephone Encounter (Signed)
 Pharmacy Patient Advocate Encounter   Received notification from Physician's Office that prior authorization for WEGOVY  is required/requested.   Insurance verification completed.     Per test claim: PA required; PA submitted to above mentioned insurance via Latent Key/confirmation #/EOC [id number] Status is pending

## 2023-10-21 ENCOUNTER — Ambulatory Visit: Admitting: Cardiology

## 2023-10-21 DIAGNOSIS — I2510 Atherosclerotic heart disease of native coronary artery without angina pectoris: Secondary | ICD-10-CM

## 2023-10-21 DIAGNOSIS — E66811 Obesity, class 1: Secondary | ICD-10-CM

## 2023-10-21 DIAGNOSIS — I10 Essential (primary) hypertension: Secondary | ICD-10-CM

## 2023-10-21 DIAGNOSIS — I7121 Aneurysm of the ascending aorta, without rupture: Secondary | ICD-10-CM

## 2023-10-22 NOTE — Telephone Encounter (Signed)
 PA status is still pending.   Waiting on insurance payer to respond.

## 2023-10-27 NOTE — Telephone Encounter (Signed)
 Pharmacy Patient Advocate Encounter  Received notification from [employer] that Prior Authorization for wegovy  2.4mg  has been DENIED.  Full denial letter will be uploaded to the media tab. See denial reason below.   PA #/Case ID/Reference #: [id number]

## 2023-10-31 NOTE — Progress Notes (Deleted)
 Cardiology Office Note:  .   Date:  10/31/2023  ID:  Dan Miller, DOB October 06, 1965, MRN [id number] PCP: Dan Norleen PHEBE PONCE, MD  [facility] Providers Cardiologist:  Dan Fitch, MD    History of Present Illness: .   Dan Miller is a 58 y.o. male with a past medical history of hypertension, CAD, ascending aortic aneurysm, OSA, dyslipidemia, morbid obesity.  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 04/06/2020 calcium  score of of 971 placing him in the 90th percentile  He established care with Dr. Fitch in 2022 for the evaluation and management of his hypertension.  He underwent a calcium  score in March or 2022 which was not 71 placing him in the 90th percentile, echocardiogram at that time revealed a preserved EF with grade 2 DD and aneurysm of the aortic root 43 mm, however his wife had just changed her job and there were some issues with insurance at that time but no further evaluation was made.   Evaluated 07/29/2022  for follow-up of his CAD and aortic dilatation.  Was started on Wegovy , arrange for CT of the chest and AAA duplex for ascending aortic dilatation. Evaluated 01/23/2023 by myself for follow-up of his CAD, doing well, staying active at work, was working on weight loss and had previously started Wegovy , we made plans to follow back up in 3 months.  We have followed up routinely every 3 months checking on his progression with weight loss and cardiovascular symptoms.  Most recently evaluated by myself 07/22/2023, his weight was 231, blood pressure was well-controlled as was his cholesterol no changes to medications or plan of care and advised to follow-up in 3 months.  07/29/2022 weight 260 01/23/2023 weight 255 04/15/2023 weight 239 07/22/2023 weight 231     ROS: Review of Systems  All other systems  reviewed and are negative.    Studies Reviewed: .        Cardiac Studies & Procedures   ______________________________________________________________________________________________     ECHOCARDIOGRAM  ECHOCARDIOGRAM COMPLETE 04/10/2020  Narrative ECHOCARDIOGRAM REPORT    Patient Name:   Dan Miller Date of Exam: 04/10/2020 Medical Rec #:  [id number]        Height:       75.0 in Accession #:    [id number]       Weight:       287.0 lb Date of Birth:  1965-05-31        BSA:          2.558 m Patient Age:    54 years         BP:           126/78 mmHg Patient Gender: M                HR:           55 bpm. Exam Location:  [facility]  Procedure: 2D Echo  Indications:    Essential hypertension [I10]  History:        Patient has no prior history of Echocardiogram examinations. Risk Factors:Dyslipidemia.  Sonographer:    Dan Miller Referring Phys: [phone] Dan Miller  IMPRESSIONS   1. Left ventricular ejection fraction, by estimation, is 50 to 55%. The left ventricle has low normal function. The left ventricle has no regional wall motion abnormalities. There is mild concentric  left ventricular hypertrophy. Left ventricular diastolic parameters are consistent with Grade II diastolic dysfunction (pseudonormalization). The average left ventricular global longitudinal strain is -10.0 %. The global longitudinal strain is abnormal. 2. Right ventricular systolic function is normal. The right ventricular size is normal. There is normal pulmonary artery systolic pressure. 3. The mitral valve is normal in structure. No evidence of mitral valve regurgitation. No evidence of mitral stenosis. 4. The aortic valve is normal in structure. Aortic valve regurgitation is not visualized. No aortic stenosis is present. 5. Aneurysm of the aortic root, measuring 43 mm. 6. The inferior vena cava is normal in size with greater than 50% respiratory variability, suggesting right atrial pressure of 3  mmHg.  FINDINGS Left Ventricle: Left ventricular ejection fraction, by estimation, is 50 to 55%. The left ventricle has low normal function. The left ventricle has no regional wall motion abnormalities. The average left ventricular global longitudinal strain is -10.0 %. The global longitudinal strain is abnormal. The left ventricular internal cavity size was normal in size. There is mild concentric left ventricular hypertrophy. Left ventricular diastolic parameters are consistent with Grade II diastolic dysfunction (pseudonormalization).  Right Ventricle: The right ventricular size is normal. No increase in right ventricular wall thickness. Right ventricular systolic function is normal. There is normal pulmonary artery systolic pressure. The tricuspid regurgitant velocity is 2.06 m/s, and with an assumed right atrial pressure of 3 mmHg, the estimated right ventricular systolic pressure is 20.0 mmHg.  Left Atrium: Left atrial size was normal in size.  Right Atrium: Right atrial size was normal in size.  Pericardium: There is no evidence of pericardial effusion. Presence of pericardial fat pad.  Mitral Valve: The mitral valve is normal in structure. No evidence of mitral valve regurgitation. No evidence of mitral valve stenosis.  Tricuspid Valve: The tricuspid valve is normal in structure. Tricuspid valve regurgitation is trivial. No evidence of tricuspid stenosis.  Aortic Valve: The aortic valve is normal in structure. Aortic valve regurgitation is not visualized. No aortic stenosis is present.  Pulmonic Valve: The pulmonic valve was normal in structure. Pulmonic valve regurgitation is not visualized. No evidence of pulmonic stenosis.  Aorta: Aortic dilatation noted. There is an aneurysm involving the aortic root measuring 43 mm.  Venous: The inferior vena cava is normal in size with greater than 50% respiratory variability, suggesting right atrial pressure of 3 mmHg.  IAS/Shunts: No  atrial level shunt detected by color flow Doppler.   LEFT VENTRICLE PLAX 2D LVIDd:         5.60 cm      Diastology LVIDs:         3.80 cm      LV e' medial:    6.64 cm/s LV PW:         1.20 cm      LV E/e' medial:  12.4 LV IVS:        1.20 cm      LV e' lateral:   11.20 cm/s LVOT diam:     2.20 cm      LV E/e' lateral: 7.3 LV SV:         78 LV SV Index:   30           2D Longitudinal Strain LVOT Area:     3.80 cm     2D Strain GLS Avg:     -10.0 %  LV Volumes (MOD) LV vol d, MOD A4C: 101.0 ml LV vol s, MOD A4C: 45.8 ml LV  SV MOD A4C:     101.0 ml  RIGHT VENTRICLE             IVC RV S prime:     11.70 cm/s  IVC diam: 1.40 cm TAPSE (M-mode): 2.4 cm  LEFT ATRIUM           Index       RIGHT ATRIUM           Index LA diam:      4.30 cm 1.68 cm/m  RA Area:     14.70 cm LA Vol (A2C): 49.9 ml 19.50 ml/m RA Volume:   31.50 ml  12.31 ml/m LA Vol (A4C): 32.4 ml 12.66 ml/m AORTIC VALVE LVOT Vmax:   81.60 cm/s LVOT Vmean:  58.100 cm/s LVOT VTI:    0.204 m  AORTA Ao Root diam:  3.20 cm Ao Sinus diam: 4.30 cm Ao STJ diam:   3.5 cm Ao Asc diam:   3.85 cm Ao Desc diam:  2.70 cm  MITRAL VALVE               TRICUSPID VALVE MV Area (PHT): 3.65 cm    TR Peak grad:   17.0 mmHg MV Decel Time: 208 msec    TR Vmax:        206.00 cm/s MV E velocity: 82.30 cm/s MV A velocity: 56.10 cm/s  SHUNTS MV E/A ratio:  1.47        Systemic VTI:  0.20 m Systemic Diam: 2.20 cm  Kardie Tobb DO Electronically signed by Dub Huntsman DO Signature Date/Time: 04/10/2020/8:40:49 PM    Final      CT SCANS  CT CARDIAC SCORING (SELF PAY ONLY) 04/06/2020  Addendum 04/07/2020 10:32 AM ADDENDUM REPORT: 04/06/2020 17:40  CLINICAL DATA:  Risk stratification  EXAM: Coronary Calcium  Score  TECHNIQUE: The patient was scanned on a [employer] scanner. Axial non-contrast 3 mm slices were carried out through the heart. The data set was analyzed on a dedicated work station and scored using the Agatson  method.  FINDINGS: Non-cardiac: See separate report from [facility].  Ascending Aorta:  Pericardium: Normal  Coronary arteries:  IMPRESSION: Coronary calcium  score of 971. This was 9 percentile for age and sex matched control.  Dan JINNY Fitch, MD   Electronically Signed By: Dan Miller On: 04/06/2020 17:40  Addendum 04/06/2020 12:06 PM ADDENDUM REPORT: 04/06/2020 12:04  CLINICAL DATA:  Cardiovascular Disease Risk stratification  EXAM: Coronary Calcium  Score  TECHNIQUE: A gated, non-contrast computed tomography scan of the heart was performed using 3mm slice thickness. Axial images were analyzed on a dedicated workstation. Calcium  scoring of the coronary arteries was performed using the Agatston method.  FINDINGS: Coronary arteries: Normal origins.  Coronary Calcium  Score:  Left main: 0  Left anterior descending artery: 123  Left circumflex artery: 35  Right coronary artery: 808  Total: 966  Percentile: 99th  Pericardium: Normal.  Ascending Aorta: Normal caliber.  Non-cardiac: See separate report from [facility]. Dilated main pulmonary artery, suggestive of pulmonary hypertension. Aortic atherosclerosis.  IMPRESSION: Coronary calcium  score of 966. This was 99th percentile for age-, race-, and sex-matched controls. Heavy calcification throughout a tortuous RCA and in the LAD.  Dilated main pulmonary artery suggestive of pulmonary hypertension.  Aortic atherosclerosis.  RECOMMENDATIONS: Coronary artery calcium  (CAC) score is a strong predictor of incident coronary heart disease (CHD) and provides predictive information beyond traditional risk factors. CAC scoring is reasonable to use in the decision to withhold, postpone, or initiate  statin therapy in intermediate-risk or selected borderline-risk asymptomatic adults (age 92-75 years and LDL-C >=70 to <190 mg/dL) who do not have diabetes or established  atherosclerotic cardiovascular disease (ASCVD).* In intermediate-risk (10-year ASCVD risk >=7.5% to <20%) adults or selected borderline-risk (10-year ASCVD risk >=5% to <7.5%) adults in whom a CAC score is measured for the purpose of making a treatment decision the following recommendations have been made:  If CAC=0, it is reasonable to withhold statin therapy and reassess in 5 to 10 years, as long as higher risk conditions are absent (diabetes mellitus, family history of premature CHD in first degree relatives (males <55 years; females <65 years), cigarette smoking, or LDL >=190 mg/dL).  If CAC is 1 to 99, it is reasonable to initiate statin therapy for patients >=55 years of age.  If CAC is >=100 or >=75th percentile, it is reasonable to initiate statin therapy at any age.  Cardiology referral should be considered for patients with CAC scores >=400 or >=75th percentile.  *2018 AHA/ACC/AACVPR/AAPA/ABC/ACPM/ADA/AGS/APhA/ASPC/NLA/PCNA Guideline on the Management of Blood Cholesterol: A Report of the American College of Cardiology/American Heart Association Task Force on Clinical Practice Guidelines. J Am Coll Cardiol. 2019;73(24):3168-3209.  Vinie Maxcy, MD   Electronically Signed By: Vinie JAYSON Maxcy M.D. On: 04/06/2020 12:04  Narrative EXAM: OVER-READ INTERPRETATION  CT CHEST  The following report is an over-read performed by radiologist Dr. Toribio Aye of [facility] on 04/06/2020. This over-read does not include interpretation of cardiac or coronary anatomy or pathology. The coronary calcium  score/coronary CTA interpretation by the cardiologist is attached.  COMPARISON:  None.  FINDINGS: Within the visualized portions of the thorax there are no suspicious appearing pulmonary nodules or masses, there is no acute consolidative airspace disease, no pleural effusions, no pneumothorax and no lymphadenopathy. Visualized portions of the upper abdomen  are unremarkable. There are no aggressive appearing lytic or blastic lesions noted in the visualized portions of the skeleton.  IMPRESSION: 1. No significant incidental noncardiac findings are noted.  Electronically Signed: By: Toribio Aye M.D. On: 04/06/2020 10:01     ______________________________________________________________________________________________      Risk Assessment/Calculations:          Physical Exam:   VS:  There were no vitals taken for this visit.   Wt Readings from Last 3 Encounters:  07/22/23 231 lb (104.8 kg)  04/15/23 239 lb (108.4 kg)  01/23/23 255 lb (115.7 kg)    GEN: Well nourished, well developed in no acute distress NECK: No JVD; No carotid bruits CARDIAC: RRR, no murmurs, rubs, gallops RESPIRATORY:  Clear to auscultation without rales, wheezing or rhonchi  ABDOMEN: Soft, non-tender, non-distended EXTREMITIES:  No edema; No deformity   ASSESSMENT AND PLAN: .   Coronary artery disease - calcium  score of of 971 placing him in the 90th percentile. Stable with no anginal symptoms. No indication for ischemic evaluation.  He is trying to augment his risk of future coronary events by losing weight.  Continue Wegovy , continue aspirin 81 mg daily, continue Crestor  20 mg daily, continue metoprolol 50 mg daily.  Will send in nitroglycerin  as needed--discussed ED precautions.  Obesity -starting BMI 34>> down to 32.7 >> 30.69 >> 29.66 . He is exercising by walking, watching his caloric intake and using Wegovy  to help prevent MACE, which is subsequently helping his obesity as well.   AAA - noted on echo, AAA duplex was negative, CTA of the chest revealed largest diameter was 3.8 cm.  He has not a smoker but states  his father also has an aneurysm.  HTN - BP is well-controlled today at 120/80, continue Zestoretic 20-12.5 mg daily, continue metoprolol 50 mg daily.  HLD -most recent LDL was well controlled at 60 04/15/2023; would like to check his LP(a) and  Apo B, he has upcoming appt with his PCP for wellness exam and they plan to collect labs, will see if they will check these as well, if not, we will recheck at next OV.        Dispo: Continue Wegovy  2.4 mg injected into the skin once a week,  follow-up in 3 months.  Signed, Delon JAYSON Hoover, NP

## 2023-11-04 ENCOUNTER — Telehealth: Payer: Self-pay | Admitting: Cardiology

## 2023-11-04 DIAGNOSIS — E66811 Obesity, class 1: Secondary | ICD-10-CM

## 2023-11-04 NOTE — Telephone Encounter (Signed)
*  STAT* If patient is at the pharmacy, call can be transferred to refill team.   1. Which medications need to be refilled? (please list name of each medication and dose if known) Semaglutide -Weight Management (WEGOVY ) 2.4 MG/0.75ML SOAJ    2. Would you like to learn more about the convenience, safety, & potential cost savings by using the [facility]?     3. Are you open to using the [facility] (Type [facility].  ).   4. Which pharmacy/location (including street and city if local pharmacy) is medication to be sent to? [facility], [place] - [address] AT [address]    5. Do they need a 30 day or 90 day supply? 30 day

## 2023-11-05 MED ORDER — WEGOVY 2.4 MG/0.75ML ~~LOC~~ SOAJ: 2.4000 mg | SUBCUTANEOUS | 12 refills | Status: DC

## 2023-11-06 ENCOUNTER — Ambulatory Visit: Admitting: Cardiology

## 2023-11-17 NOTE — Progress Notes (Signed)
 Cardiology Office Note:  .   Date:  11/20/2023  ID:  Dan Miller, DOB [birth year], MRN [id number] PCP: [name], MD  [facility] Providers Cardiologist:  Lamar Fitch, MD    History of Present Illness: .   Dan Miller is a 58 y.o. male with a past medical history of hypertension, CAD, ascending aortic aneurysm, OSA, dyslipidemia, morbid obesity.  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 04/06/2020 calcium  score of of 971 placing him in the 90th percentile  He established care with Dr. Fitch in 2022 for the evaluation and management of his hypertension.  He underwent a calcium  score in March or 2022 which was not 71 placing him in the 90th percentile, echocardiogram at that time revealed a preserved EF with grade 2 DD and aneurysm of the aortic root 43 mm, however his wife had just changed her job and there were some issues with insurance at that time but no further evaluation was made.   Evaluated 07/29/2022  for follow-up of his CAD and aortic dilatation.  Was started on Wegovy , arrange for CT of the chest and AAA duplex for ascending aortic dilatation. Evaluated 01/23/2023 by myself for follow-up of his CAD, doing well, staying active at work, was working on weight loss and had previously started Wegovy , we made plans to follow back up in 3 months.  We have followed up routinely every 3 months checking on his progression with weight loss and cardiovascular symptoms.  Most recently evaluated by myself 07/22/2023, his weight was 231, blood pressure was well-controlled as was his cholesterol no changes to medications or plan of care and advised to follow-up in 3 months.  He presents today for follow up of his CAD. He has been doing well, exercising, trying to eat right, and continues to have success with weight  loss. Mentions he received some denial regarding his Wegovy , understandably concerned about this as he has a very elevated CAC score. He denies chest pain, palpitations, dyspnea, pnd, orthopnea, n, v, dizziness, syncope, edema, weight gain, or early satiety.   07/29/2022 weight 260 01/23/2023 weight 255 04/15/2023 weight 239 07/22/2023 weight 231 11/20/2023 weight 228     ROS: Review of Systems  All other systems reviewed and are negative.    Studies Reviewed: .        Cardiac Studies & Procedures   ______________________________________________________________________________________________     ECHOCARDIOGRAM  ECHOCARDIOGRAM COMPLETE 04/10/2020  Narrative ECHOCARDIOGRAM REPORT    Patient Name:   Dan Miller Date of Exam: 04/10/2020 Medical Rec #:  [id number]        Height:       75.0 in Accession #:    [id number]       Weight:       287.0 lb Date of Birth:  Nov 02, 1965        BSA:          2.558 m Patient Age:    54 years         BP:           126/78 mmHg Patient Gender: M                HR:           55 bpm. Exam Location:  [place]  Procedure: 2D Echo  Indications:  Essential hypertension [I10]  History:        Patient has no prior history of Echocardiogram examinations. Risk Factors:Dyslipidemia.  Sonographer:    Lynwood Silvas Referring Phys: [phone] ROBERT J KRASOWSKI  IMPRESSIONS   1. Left ventricular ejection fraction, by estimation, is 50 to 55%. The left ventricle has low normal function. The left ventricle has no regional wall motion abnormalities. There is mild concentric left ventricular hypertrophy. Left ventricular diastolic parameters are consistent with Grade II diastolic dysfunction (pseudonormalization). The average left ventricular global longitudinal strain is -10.0 %. The global longitudinal strain is abnormal. 2. Right ventricular systolic function is normal. The right ventricular size is normal. There is normal pulmonary artery systolic  pressure. 3. The mitral valve is normal in structure. No evidence of mitral valve regurgitation. No evidence of mitral stenosis. 4. The aortic valve is normal in structure. Aortic valve regurgitation is not visualized. No aortic stenosis is present. 5. Aneurysm of the aortic root, measuring 43 mm. 6. The inferior vena cava is normal in size with greater than 50% respiratory variability, suggesting right atrial pressure of 3 mmHg.  FINDINGS Left Ventricle: Left ventricular ejection fraction, by estimation, is 50 to 55%. The left ventricle has low normal function. The left ventricle has no regional wall motion abnormalities. The average left ventricular global longitudinal strain is -10.0 %. The global longitudinal strain is abnormal. The left ventricular internal cavity size was normal in size. There is mild concentric left ventricular hypertrophy. Left ventricular diastolic parameters are consistent with Grade II diastolic dysfunction (pseudonormalization).  Right Ventricle: The right ventricular size is normal. No increase in right ventricular wall thickness. Right ventricular systolic function is normal. There is normal pulmonary artery systolic pressure. The tricuspid regurgitant velocity is 2.06 m/s, and with an assumed right atrial pressure of 3 mmHg, the estimated right ventricular systolic pressure is 20.0 mmHg.  Left Atrium: Left atrial size was normal in size.  Right Atrium: Right atrial size was normal in size.  Pericardium: There is no evidence of pericardial effusion. Presence of pericardial fat pad.  Mitral Valve: The mitral valve is normal in structure. No evidence of mitral valve regurgitation. No evidence of mitral valve stenosis.  Tricuspid Valve: The tricuspid valve is normal in structure. Tricuspid valve regurgitation is trivial. No evidence of tricuspid stenosis.  Aortic Valve: The aortic valve is normal in structure. Aortic valve regurgitation is not visualized. No aortic  stenosis is present.  Pulmonic Valve: The pulmonic valve was normal in structure. Pulmonic valve regurgitation is not visualized. No evidence of pulmonic stenosis.  Aorta: Aortic dilatation noted. There is an aneurysm involving the aortic root measuring 43 mm.  Venous: The inferior vena cava is normal in size with greater than 50% respiratory variability, suggesting right atrial pressure of 3 mmHg.  IAS/Shunts: No atrial level shunt detected by color flow Doppler.   LEFT VENTRICLE PLAX 2D LVIDd:         5.60 cm      Diastology LVIDs:         3.80 cm      LV e' medial:    6.64 cm/s LV PW:         1.20 cm      LV E/e' medial:  12.4 LV IVS:        1.20 cm      LV e' lateral:   11.20 cm/s LVOT diam:     2.20 cm      LV E/e' lateral: 7.3 LV  SV:         78 LV SV Index:   30           2D Longitudinal Strain LVOT Area:     3.80 cm     2D Strain GLS Avg:     -10.0 %  LV Volumes (MOD) LV vol d, MOD A4C: 101.0 ml LV vol s, MOD A4C: 45.8 ml LV SV MOD A4C:     101.0 ml  RIGHT VENTRICLE             IVC RV S prime:     11.70 cm/s  IVC diam: 1.40 cm TAPSE (M-mode): 2.4 cm  LEFT ATRIUM           Index       RIGHT ATRIUM           Index LA diam:      4.30 cm 1.68 cm/m  RA Area:     14.70 cm LA Vol (A2C): 49.9 ml 19.50 ml/m RA Volume:   31.50 ml  12.31 ml/m LA Vol (A4C): 32.4 ml 12.66 ml/m AORTIC VALVE LVOT Vmax:   81.60 cm/s LVOT Vmean:  58.100 cm/s LVOT VTI:    0.204 m  AORTA Ao Root diam:  3.20 cm Ao Sinus diam: 4.30 cm Ao STJ diam:   3.5 cm Ao Asc diam:   3.85 cm Ao Desc diam:  2.70 cm  MITRAL VALVE               TRICUSPID VALVE MV Area (PHT): 3.65 cm    TR Peak grad:   17.0 mmHg MV Decel Time: 208 msec    TR Vmax:        206.00 cm/s MV E velocity: 82.30 cm/s MV A velocity: 56.10 cm/s  SHUNTS MV E/A ratio:  1.47        Systemic VTI:  0.20 m Systemic Diam: 2.20 cm  Kardie Tobb DO Electronically signed by Dub Huntsman DO Signature Date/Time: 04/10/2020/8:40:49  PM    Final      CT SCANS  CT CARDIAC SCORING (SELF PAY ONLY) 04/06/2020  Addendum 04/07/2020 10:32 AM ADDENDUM REPORT: 04/06/2020 17:40  CLINICAL DATA:  Risk stratification  EXAM: Coronary Calcium  Score  TECHNIQUE: The patient was scanned on a [employer] scanner. Axial non-contrast 3 mm slices were carried out through the heart. The data set was analyzed on a dedicated work station and scored using the Agatson method.  FINDINGS: Non-cardiac: See separate report from [facility].  Ascending Aorta:  Pericardium: Normal  Coronary arteries:  IMPRESSION: Coronary calcium  score of 971. This was 50 percentile for age and sex matched control.  Lamar JINNY Fitch, MD   Electronically Signed By: Lamar Fitch On: 04/06/2020 17:40  Addendum 04/06/2020 12:06 PM ADDENDUM REPORT: 04/06/2020 12:04  CLINICAL DATA:  Cardiovascular Disease Risk stratification  EXAM: Coronary Calcium  Score  TECHNIQUE: A gated, non-contrast computed tomography scan of the heart was performed using 3mm slice thickness. Axial images were analyzed on a dedicated workstation. Calcium  scoring of the coronary arteries was performed using the Agatston method.  FINDINGS: Coronary arteries: Normal origins.  Coronary Calcium  Score:  Left main: 0  Left anterior descending artery: 123  Left circumflex artery: 35  Right coronary artery: 808  Total: 966  Percentile: 99th  Pericardium: Normal.  Ascending Aorta: Normal caliber.  Non-cardiac: See separate report from [facility]. Dilated main pulmonary artery, suggestive of pulmonary hypertension. Aortic atherosclerosis.  IMPRESSION: Coronary calcium  score of 966. This  was 99th percentile for age-, race-, and sex-matched controls. Heavy calcification throughout a tortuous RCA and in the LAD.  Dilated main pulmonary artery suggestive of pulmonary hypertension.  Aortic  atherosclerosis.  RECOMMENDATIONS: Coronary artery calcium  (CAC) score is a strong predictor of incident coronary heart disease (CHD) and provides predictive information beyond traditional risk factors. CAC scoring is reasonable to use in the decision to withhold, postpone, or initiate statin therapy in intermediate-risk or selected borderline-risk asymptomatic adults (age 75-75 years and LDL-C >=70 to <190 mg/dL) who do not have diabetes or established atherosclerotic cardiovascular disease (ASCVD).* In intermediate-risk (10-year ASCVD risk >=7.5% to <20%) adults or selected borderline-risk (10-year ASCVD risk >=5% to <7.5%) adults in whom a CAC score is measured for the purpose of making a treatment decision the following recommendations have been made:  If CAC=0, it is reasonable to withhold statin therapy and reassess in 5 to 10 years, as long as higher risk conditions are absent (diabetes mellitus, family history of premature CHD in first degree relatives (males <55 years; females <65 years), cigarette smoking, or LDL >=190 mg/dL).  If CAC is 1 to 99, it is reasonable to initiate statin therapy for patients >=91 years of age.  If CAC is >=100 or >=75th percentile, it is reasonable to initiate statin therapy at any age.  Cardiology referral should be considered for patients with CAC scores >=400 or >=75th percentile.  *2018 AHA/ACC/AACVPR/AAPA/ABC/ACPM/ADA/AGS/APhA/ASPC/NLA/PCNA Guideline on the Management of Blood Cholesterol: A Report of the American College of Cardiology/American Heart Association Task Force on Clinical Practice Guidelines. J Am Coll Cardiol. 2019;73(24):3168-3209.  Vinie Maxcy, MD   Electronically Signed By: Vinie JAYSON Maxcy M.D. On: 04/06/2020 12:04  Narrative EXAM: OVER-READ INTERPRETATION  CT CHEST  The following report is an over-read performed by radiologist Dr. Toribio Aye of [facility] on 04/06/2020. This over-read  does not include interpretation of cardiac or coronary anatomy or pathology. The coronary calcium  score/coronary CTA interpretation by the cardiologist is attached.  COMPARISON:  None.  FINDINGS: Within the visualized portions of the thorax there are no suspicious appearing pulmonary nodules or masses, there is no acute consolidative airspace disease, no pleural effusions, no pneumothorax and no lymphadenopathy. Visualized portions of the upper abdomen are unremarkable. There are no aggressive appearing lytic or blastic lesions noted in the visualized portions of the skeleton.  IMPRESSION: 1. No significant incidental noncardiac findings are noted.  Electronically Signed: By: Toribio Aye M.D. On: 04/06/2020 10:01     ______________________________________________________________________________________________      Risk Assessment/Calculations:          Physical Exam:   VS:  BP 128/76   Pulse 60   Ht 6' 2 (1.88 m)   Wt 228 lb (103.4 kg)   SpO2 96%   BMI 29.27 kg/m    Wt Readings from Last 3 Encounters:  11/20/23 228 lb (103.4 kg)  07/22/23 231 lb (104.8 kg)  04/15/23 239 lb (108.4 kg)    GEN: Well nourished, well developed in no acute distress NECK: No JVD; No carotid bruits CARDIAC: RRR, no murmurs, rubs, gallops RESPIRATORY:  Clear to auscultation without rales, wheezing or rhonchi  ABDOMEN: Soft, non-tender, non-distended EXTREMITIES:  No edema; No deformity   ASSESSMENT AND PLAN: .   Coronary artery disease - calcium  score of of 971 placing him in the 90th percentile. Stable with no anginal symptoms. No indication for ischemic evaluation.  He is trying to augment his risk of future coronary events by losing weight.  Continue Wegovy  2.4 mg injected into the skin weekly, continue aspirin 81 mg daily, continue Crestor  20 mg daily, continue metoprolol 50 mg daily.  NTG prn.   Obesity -starting BMI 34>> down to 32.7 >> 30.69 >> 29.66 >>29.2. Would like for  him to lose another 10 -15 lbs. He is exercising by walking, watching his caloric intake and using Wegovy  to help prevent MACE, which is subsequently helping his obesity as well.   AAA - noted on echo, AAA duplex was negative, CTA of the chest revealed largest diameter was 3.8 cm.  He has not a smoker but states his father also has an aneurysm.  HTN - BP is well-controlled today at 128/76, continue Zestoretic 20-12.5 mg daily, continue metoprolol 50 mg daily.  HLD -Repeat CMET, FLP and LP(a) today.     Dispo: Continue Wegovy  2.4 mg injected into the skin once a week, CMET, FLP, LP(a),  follow-up in 3 months.  Signed, Delon JAYSON Hoover, NP

## 2023-11-20 ENCOUNTER — Encounter: Payer: Self-pay | Admitting: Cardiology

## 2023-11-20 ENCOUNTER — Ambulatory Visit: Attending: Cardiology | Admitting: Cardiology

## 2023-11-20 VITALS — BP 128/76 | HR 60 | Ht 74.0 in | Wt 228.0 lb

## 2023-11-20 DIAGNOSIS — I2510 Atherosclerotic heart disease of native coronary artery without angina pectoris: Secondary | ICD-10-CM | POA: Diagnosis not present

## 2023-11-20 DIAGNOSIS — E782 Mixed hyperlipidemia: Secondary | ICD-10-CM

## 2023-11-20 DIAGNOSIS — I10 Essential (primary) hypertension: Secondary | ICD-10-CM | POA: Diagnosis not present

## 2023-11-20 DIAGNOSIS — E66811 Obesity, class 1: Secondary | ICD-10-CM | POA: Diagnosis not present

## 2023-11-20 NOTE — Patient Instructions (Signed)
 Medication Instructions:   No changes   *If you need a refill on your cardiac medications before your next appointment, please call your pharmacy*  Lab Work: CMET FLP LPA   If you have labs (blood work) drawn today and your tests are completely normal, you will receive your results only by: MyChart Message (if you have MyChart) OR A paper copy in the mail If you have any lab test that is abnormal or we need to change your treatment, we will call you to review the results.  Testing/Procedures:   Follow-Up: At [facility], you and your health needs are our priority.  As part of our continuing mission to provide you with exceptional heart care, our providers are all part of one team.  This team includes your primary Cardiologist (physician) and Advanced Practice Providers or APPs (Physician Assistants and Nurse Practitioners) who all work together to provide you with the care you need, when you need it.  Your next appointment:   3 month(s)  Provider:   [place] (APP) or PharmD   We recommend signing up for the patient portal called MyChart.  Sign up information is provided on this After Visit Summary.  MyChart is used to connect with patients for Virtual Visits (Telemedicine).  Patients are able to view lab/test results, encounter notes, upcoming appointments, etc.  Non-urgent messages can be sent to your provider as well.   To learn more about what you can do with MyChart, go to [email or web address].   Other Instructions

## 2023-11-24 DIAGNOSIS — I2510 Atherosclerotic heart disease of native coronary artery without angina pectoris: Secondary | ICD-10-CM | POA: Diagnosis not present

## 2023-11-24 LAB — LIPID PANEL

## 2023-11-25 ENCOUNTER — Encounter: Payer: Self-pay | Admitting: Pharmacist

## 2023-11-25 DIAGNOSIS — E66811 Obesity, class 1: Secondary | ICD-10-CM

## 2023-11-25 LAB — COMPREHENSIVE METABOLIC PANEL WITH GFR
ALT: 17 IU/L (ref 0–44)
AST: 20 IU/L (ref 0–40)
Albumin: 4.5 g/dL (ref 3.8–4.9)
Alkaline Phosphatase: 66 IU/L (ref 47–123)
BUN/Creatinine Ratio: 18 (ref 9–20)
BUN: 18 mg/dL (ref 6–24)
Bilirubin Total: 0.4 mg/dL (ref 0.0–1.2)
CO2: 24 mmol/L (ref 20–29)
Calcium: 9.9 mg/dL (ref 8.7–10.2)
Chloride: 102 mmol/L (ref 96–106)
Creatinine, Ser: 0.99 mg/dL (ref 0.76–1.27)
Globulin, Total: 2.3 g/dL (ref 1.5–4.5)
Glucose: 98 mg/dL (ref 70–99)
Potassium: 4.6 mmol/L (ref 3.5–5.2)
Sodium: 140 mmol/L (ref 134–144)
Total Protein: 6.8 g/dL (ref 6.0–8.5)
eGFR: 88 mL/min/1.73

## 2023-11-25 LAB — LIPOPROTEIN A (LPA): Lipoprotein (a): 8.5 nmol/L

## 2023-11-25 LAB — LIPID PANEL
Chol/HDL Ratio: 2.6 ratio (ref 0.0–5.0)
Cholesterol, Total: 145 mg/dL (ref 100–199)
HDL: 56 mg/dL
LDL Chol Calc (NIH): 73 mg/dL (ref 0–99)
Triglycerides: 83 mg/dL (ref 0–149)
VLDL Cholesterol Cal: 16 mg/dL (ref 5–40)

## 2023-11-26 ENCOUNTER — Ambulatory Visit: Payer: Self-pay | Admitting: Cardiology

## 2023-11-26 ENCOUNTER — Telehealth: Payer: Self-pay

## 2023-11-26 MED ORDER — ROSUVASTATIN CALCIUM 40 MG PO TABS: 40.0000 mg | ORAL_TABLET | Freq: Every day | ORAL | 3 refills | Status: AC

## 2023-11-26 NOTE — Telephone Encounter (Signed)
 RPH has reached out to pt regarding medication change. Waiting on clarification to proceed with PA.

## 2023-11-26 NOTE — Progress Notes (Signed)
 Spoke with pt. Confirmed that he can take 2 of the Rosuvastatin  20mg  tablets for now. Sent th 40mg  tablets to his pharmacy

## 2023-11-26 NOTE — Telephone Encounter (Signed)
-----   Message from Dan Miller sent at 11/20/2023 12:47 PM EST ----- Hey!  Can you help me with him.  I have had him on Wegovy  for1 year for MACE, he gave me an insurance letter today and I am not exactly sure what to do with it but it states that insurance will deny it because criteria was not met... Then on another page it sa fax something to prior authorization and appeals form.    I have the paperwork I could show you if needed.[email or web address]

## 2023-12-01 ENCOUNTER — Other Ambulatory Visit (HOSPITAL_COMMUNITY): Payer: Self-pay

## 2023-12-01 NOTE — Telephone Encounter (Addendum)
 Per RPH complete PA for Mounjaro for DM, trying to locate A1C labs. Reached out to [facility] to advise.

## 2023-12-01 NOTE — Telephone Encounter (Signed)
 Were we able to locate an A1C above 6.5% for patient?

## 2023-12-02 ENCOUNTER — Other Ambulatory Visit (HOSPITAL_COMMUNITY): Payer: Self-pay

## 2023-12-02 NOTE — Telephone Encounter (Signed)
 Pharmacy Patient Advocate Encounter   Received notification from Physician's Office that prior authorization for ZEPBOUND is required/requested.   Insurance verification completed.   The patient is insured through [facility].   Per test claim: PA required; PA submitted to above mentioned insurance via Latent Key/confirmation #/EOC [id number] Status is pending

## 2023-12-02 NOTE — Telephone Encounter (Signed)
 Per RPH, we were unable to locate previous DM chart notes/A1C labs. Submitting Zepbound under OSA indication instead.

## 2023-12-03 NOTE — Telephone Encounter (Signed)
 Erroneous encounter

## 2023-12-09 ENCOUNTER — Other Ambulatory Visit (HOSPITAL_COMMUNITY): Payer: Self-pay

## 2023-12-09 NOTE — Telephone Encounter (Signed)
 Pharmacy Patient Advocate Encounter  Received notification from [facility] MEDICAID that Prior Authorization for LUCYRX has been APPROVED from 12/08/23 to 06/07/24. Ran test claim, Copay is $24.99 (EVOUCHER PAID $55.01 TOWARDS COPAY). This test claim was processed through [facility]- copay amounts may vary at other pharmacies due to pharmacy/plan contracts, or as the patient moves through the different stages of their insurance plan.

## 2023-12-10 MED ORDER — TIRZEPATIDE-WEIGHT MANAGEMENT 2.5 MG/0.5ML ~~LOC~~ SOAJ: 2.5000 mg | SUBCUTANEOUS | 0 refills | Status: DC

## 2023-12-10 NOTE — Addendum Note (Signed)
 Addended by: DARRELL BRUCKNER on: 12/10/2023 11:53 AM   Modules accepted: Orders

## 2024-01-09 MED ORDER — ZEPBOUND 5 MG/0.5ML ~~LOC~~ SOAJ: 5.0000 mg | SUBCUTANEOUS | 0 refills | Status: DC

## 2024-01-09 NOTE — Addendum Note (Signed)
 Addended by: DARRELL BAREFOOT on: 01/09/2024 02:22 PM   Modules accepted: Orders

## 2024-02-04 ENCOUNTER — Other Ambulatory Visit: Payer: Self-pay | Admitting: Cardiology

## 2024-03-03 ENCOUNTER — Ambulatory Visit: Admitting: Cardiology
